# Patient Record
Sex: Female | Born: 1965 | Race: White | Hispanic: No | Marital: Married | Smoking: Current every day smoker
Health system: Southern US, Community
[De-identification: ages and names within clinical notes are randomized; demographics above are authoritative.]

## PROBLEM LIST (undated history)

## (undated) DIAGNOSIS — I10 Essential (primary) hypertension: Secondary | ICD-10-CM

## (undated) DIAGNOSIS — F17200 Nicotine dependence, unspecified, uncomplicated: Secondary | ICD-10-CM

## (undated) DIAGNOSIS — I712 Thoracic aortic aneurysm, without rupture, unspecified: Secondary | ICD-10-CM

## (undated) DIAGNOSIS — E7800 Pure hypercholesterolemia, unspecified: Secondary | ICD-10-CM

## (undated) HISTORY — DX: Nicotine dependence, unspecified, uncomplicated: F17.200

## (undated) HISTORY — DX: Thoracic aortic aneurysm, without rupture, unspecified: I71.20

## (undated) HISTORY — PX: WISDOM TOOTH EXTRACTION: SHX21

## (undated) HISTORY — DX: Essential (primary) hypertension: I10

## (undated) HISTORY — DX: Thoracic aortic aneurysm, without rupture: I71.2

## (undated) HISTORY — PX: TUBAL LIGATION: SHX77

## (undated) HISTORY — DX: Pure hypercholesterolemia, unspecified: E78.00

---

## 2005-04-26 ENCOUNTER — Ambulatory Visit: Payer: Self-pay | Admitting: Family Medicine

## 2005-07-18 HISTORY — PX: DESCENDING AORTIC ANEURYSM REPAIR: SHX1455

## 2005-11-03 ENCOUNTER — Ambulatory Visit: Payer: Self-pay | Admitting: Family Medicine

## 2005-11-08 ENCOUNTER — Encounter: Admission: RE | Admit: 2005-11-08 | Discharge: 2005-11-08 | Payer: Self-pay | Admitting: Vascular Surgery

## 2005-11-11 ENCOUNTER — Inpatient Hospital Stay (HOSPITAL_BASED_OUTPATIENT_CLINIC_OR_DEPARTMENT_OTHER): Admission: RE | Admit: 2005-11-11 | Discharge: 2005-11-11 | Payer: Self-pay | Admitting: Cardiology

## 2005-11-14 ENCOUNTER — Inpatient Hospital Stay (HOSPITAL_COMMUNITY): Admission: RE | Admit: 2005-11-14 | Discharge: 2005-11-18 | Payer: Self-pay | Admitting: Surgery

## 2005-11-14 ENCOUNTER — Encounter (INDEPENDENT_AMBULATORY_CARE_PROVIDER_SITE_OTHER): Payer: Self-pay | Admitting: *Deleted

## 2005-11-29 ENCOUNTER — Encounter: Admission: RE | Admit: 2005-11-29 | Discharge: 2005-11-29 | Payer: Self-pay | Admitting: Surgery

## 2006-03-02 ENCOUNTER — Ambulatory Visit: Payer: Self-pay | Admitting: Family Medicine

## 2006-05-23 ENCOUNTER — Ambulatory Visit: Payer: Self-pay | Admitting: Physician Assistant

## 2006-06-14 ENCOUNTER — Ambulatory Visit: Payer: Self-pay | Admitting: Family Medicine

## 2006-08-15 ENCOUNTER — Encounter: Admission: RE | Admit: 2006-08-15 | Discharge: 2006-08-15 | Payer: Self-pay | Admitting: Cardiology

## 2006-09-08 ENCOUNTER — Ambulatory Visit: Payer: Self-pay | Admitting: Family Medicine

## 2006-11-08 ENCOUNTER — Ambulatory Visit: Payer: Self-pay | Admitting: Family Medicine

## 2007-03-26 ENCOUNTER — Encounter: Admission: RE | Admit: 2007-03-26 | Discharge: 2007-03-26 | Payer: Self-pay | Admitting: Dermatology

## 2007-09-10 IMAGING — CT CT ANGIO ABDOMEN
2 of 7 series · 17 of 46 positions shown, 19 images · IV contrast ([ID] OMNI 300)
Comparison: none

CLINICAL DATA: Prestent AAA. 
 CT ANGIOGRAM OF THE ABDOMEN:
TECHNIQUE: Multidetector CT imaging of the abdomen was performed before and during bolus injection of intravenous contrast.  Multiplanar CT angiographic image reconstructions were generated to evaluate the vascular anatomy.
 Contrast:  150 cc Omnipaque 300
TECHNIQUE: Multidetector CT imaging of the pelvis was performed during bolus injection of intravenous contrast.  Multiplanar CT angiographic image reconstructions were generated to evaluate the vascular anatomy.

[Series 5: recon 2: angio · axial · 0.66mm/px · z∈[-366,-23]mm · 14 of 297 slices shown, 16 images]
[im 16/297  soft-tissue]
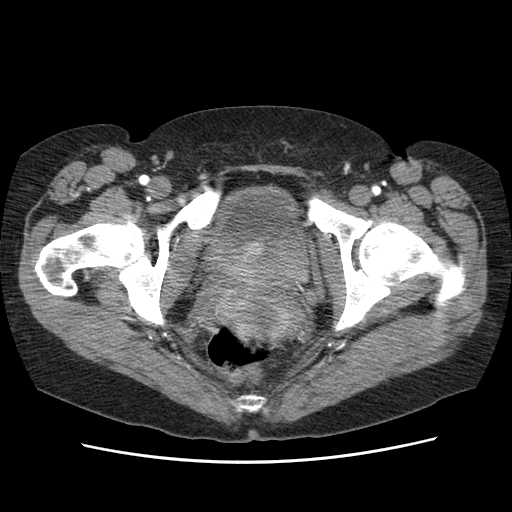
[im 16/297  bone]
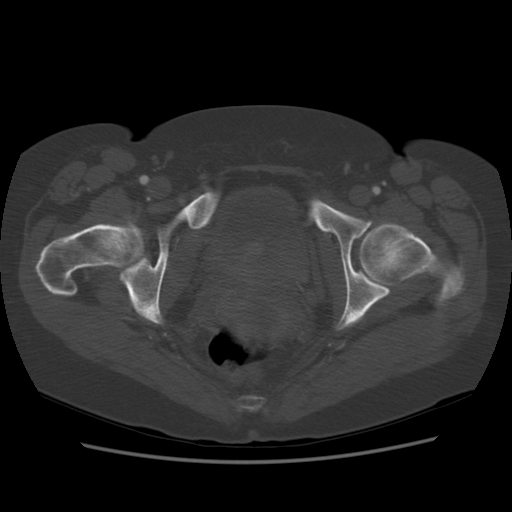
[im 32/297  soft-tissue]
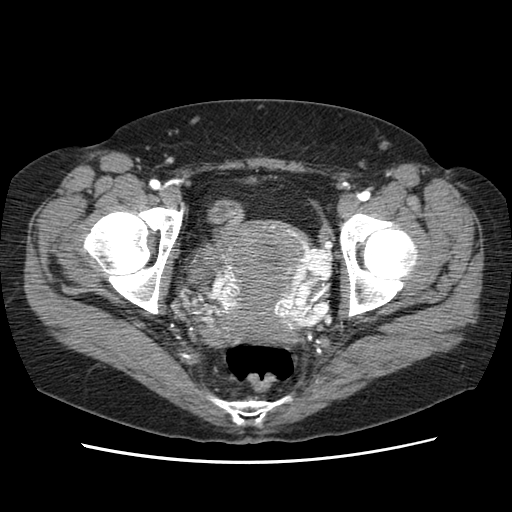
[im 63/297  soft-tissue]
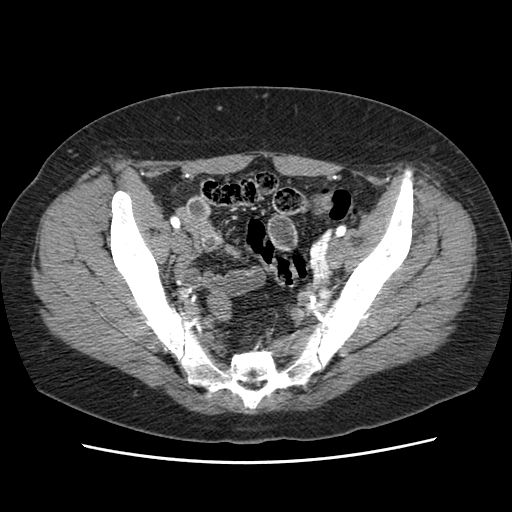
[im 78/297  soft-tissue]
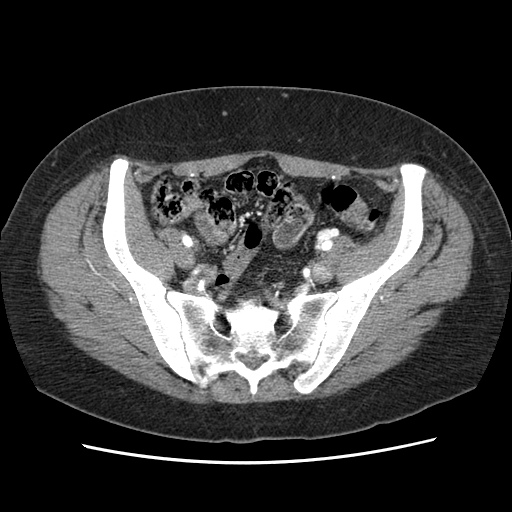
[im 94/297  soft-tissue]
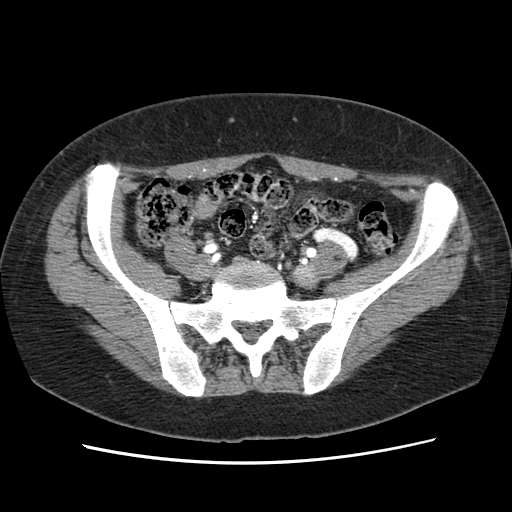
[im 125/297  soft-tissue]
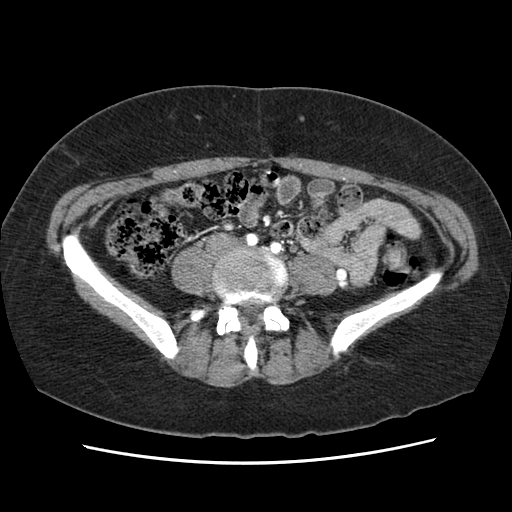
[im 141/297  soft-tissue]
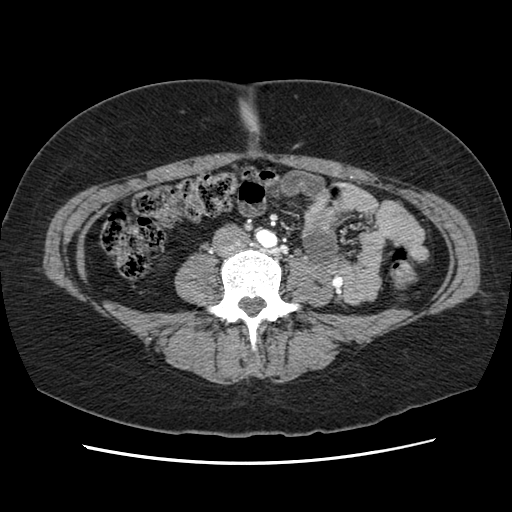
[im 156/297  soft-tissue]
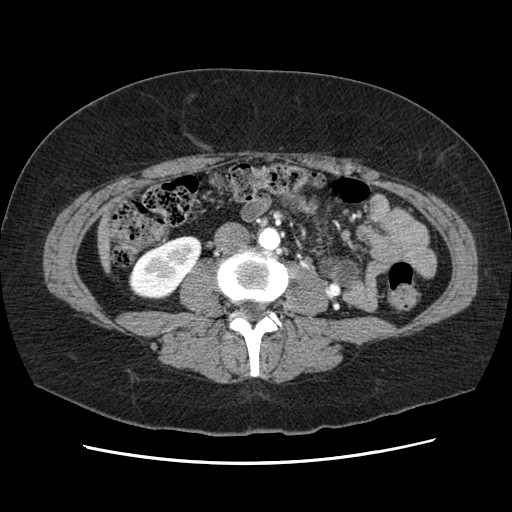
[im 172/297  soft-tissue]
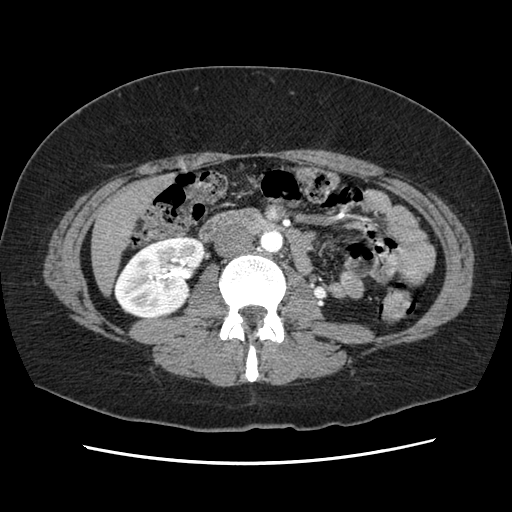
[im 172/297  bone]
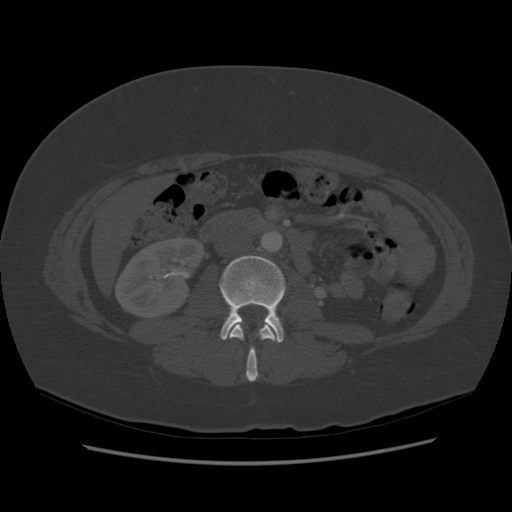
[im 203/297  soft-tissue]
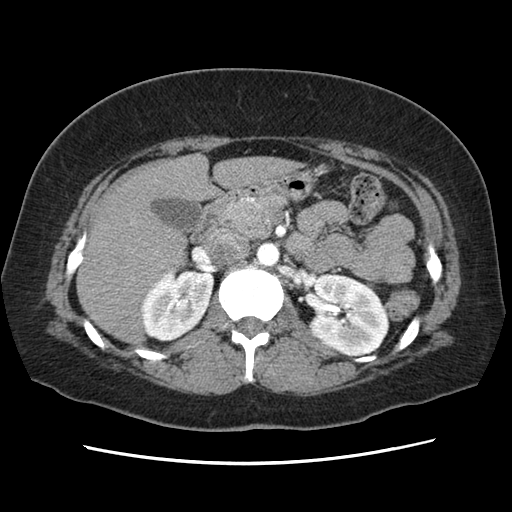
[im 219/297  soft-tissue]
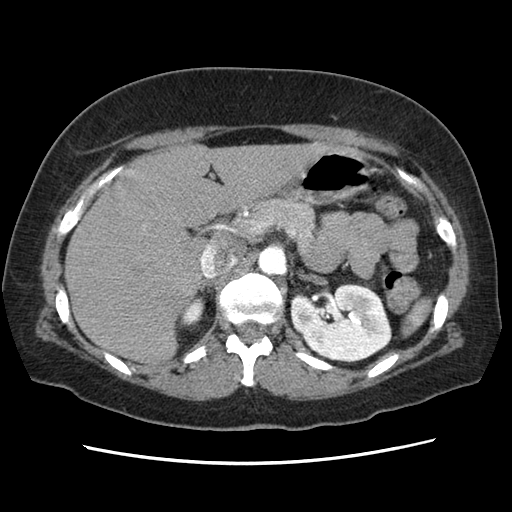
[im 234/297  soft-tissue]
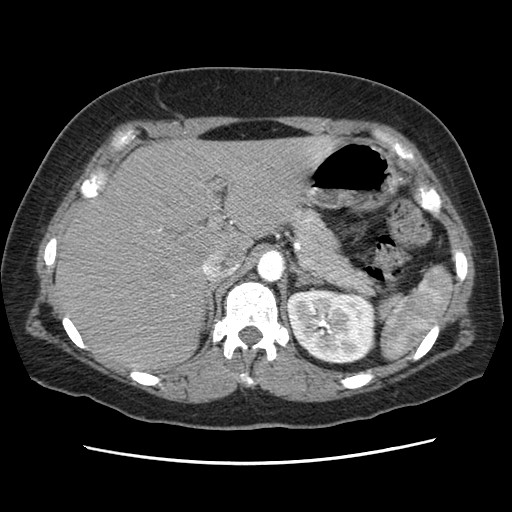
[im 265/297  soft-tissue]
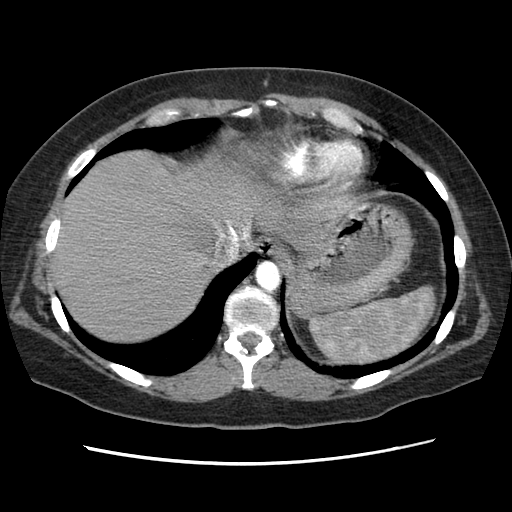
[im 281/297  soft-tissue]
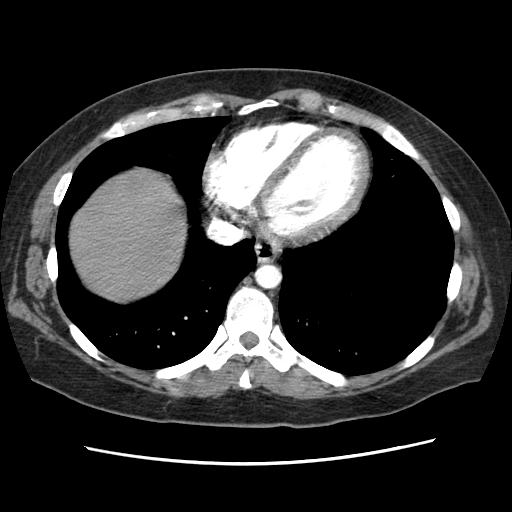

[Series 265: reformatted · sagittal · 0.80mm/px · 3 of 120 slices shown]
[im 40/120  soft-tissue]
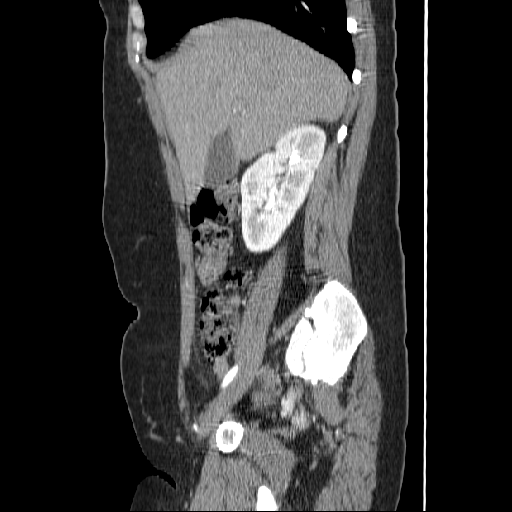
[im 53/120  soft-tissue]
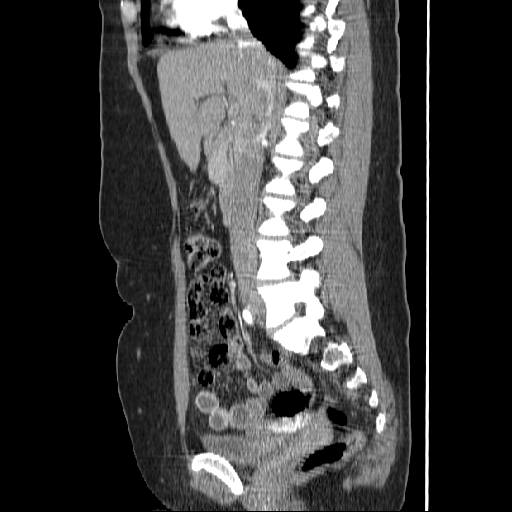
[im 67/120  soft-tissue]
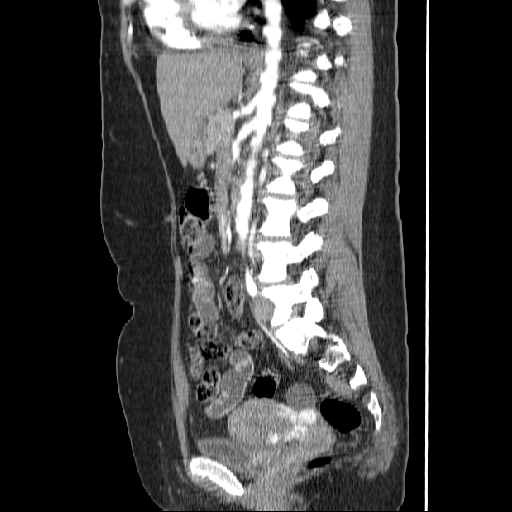

[17 of 46 positions shown; findings below may reference images not displayed]

FINDINGS: The scan demonstrates that there is some mild atheromatous irregularity of the distal abdominal aorta, but there is no evidence of aneurysm.  There is no significant calcification in the abdominal aorta or iliac arteries.  The celiac and superior mesenteric arteries are widely patent.  The patient has one widely patent right renal artery and two widely patent left renal arteries.  The lower pole branch is small on the left and is best seen on image #108 of series 5.  
 The liver, spleen, pancreas, adrenal glands, and kidneys appear normal.  No dilated bowel.  No bony abnormality.
IMPRESSION: Essentially normal CT angiogram of the abdomen.  The patient has two left renal arteries and a single right renal artery with some minimal atheromatous plaque in the distal abdominal aorta and proximal common iliac arteries.  
 CT ANGIOGRAM OF THE PELVIS:
FINDINGS: There is some mild atheromatous disease of the common iliac arteries bilaterally.  The external iliac and common femoral arteries appear normal. There is a 4.1 x 2.8 cm cyst on the left ovary.  The right ovary is not discretely identified.
IMPRESSION: Minimal atheromatous disease in the common iliac arteries.  No aneurysm formation or significant stenosis.

## 2007-09-17 IMAGING — CR DG CHEST 1V PORT
1 series · 1 of 1 positions shown · non-contrast
Comparison: 1-day prior.

CLINICAL DATA: Descending thoracic aneurysm.  
 PORTABLE CHEST ? 1 VIEW:

[view not recorded]
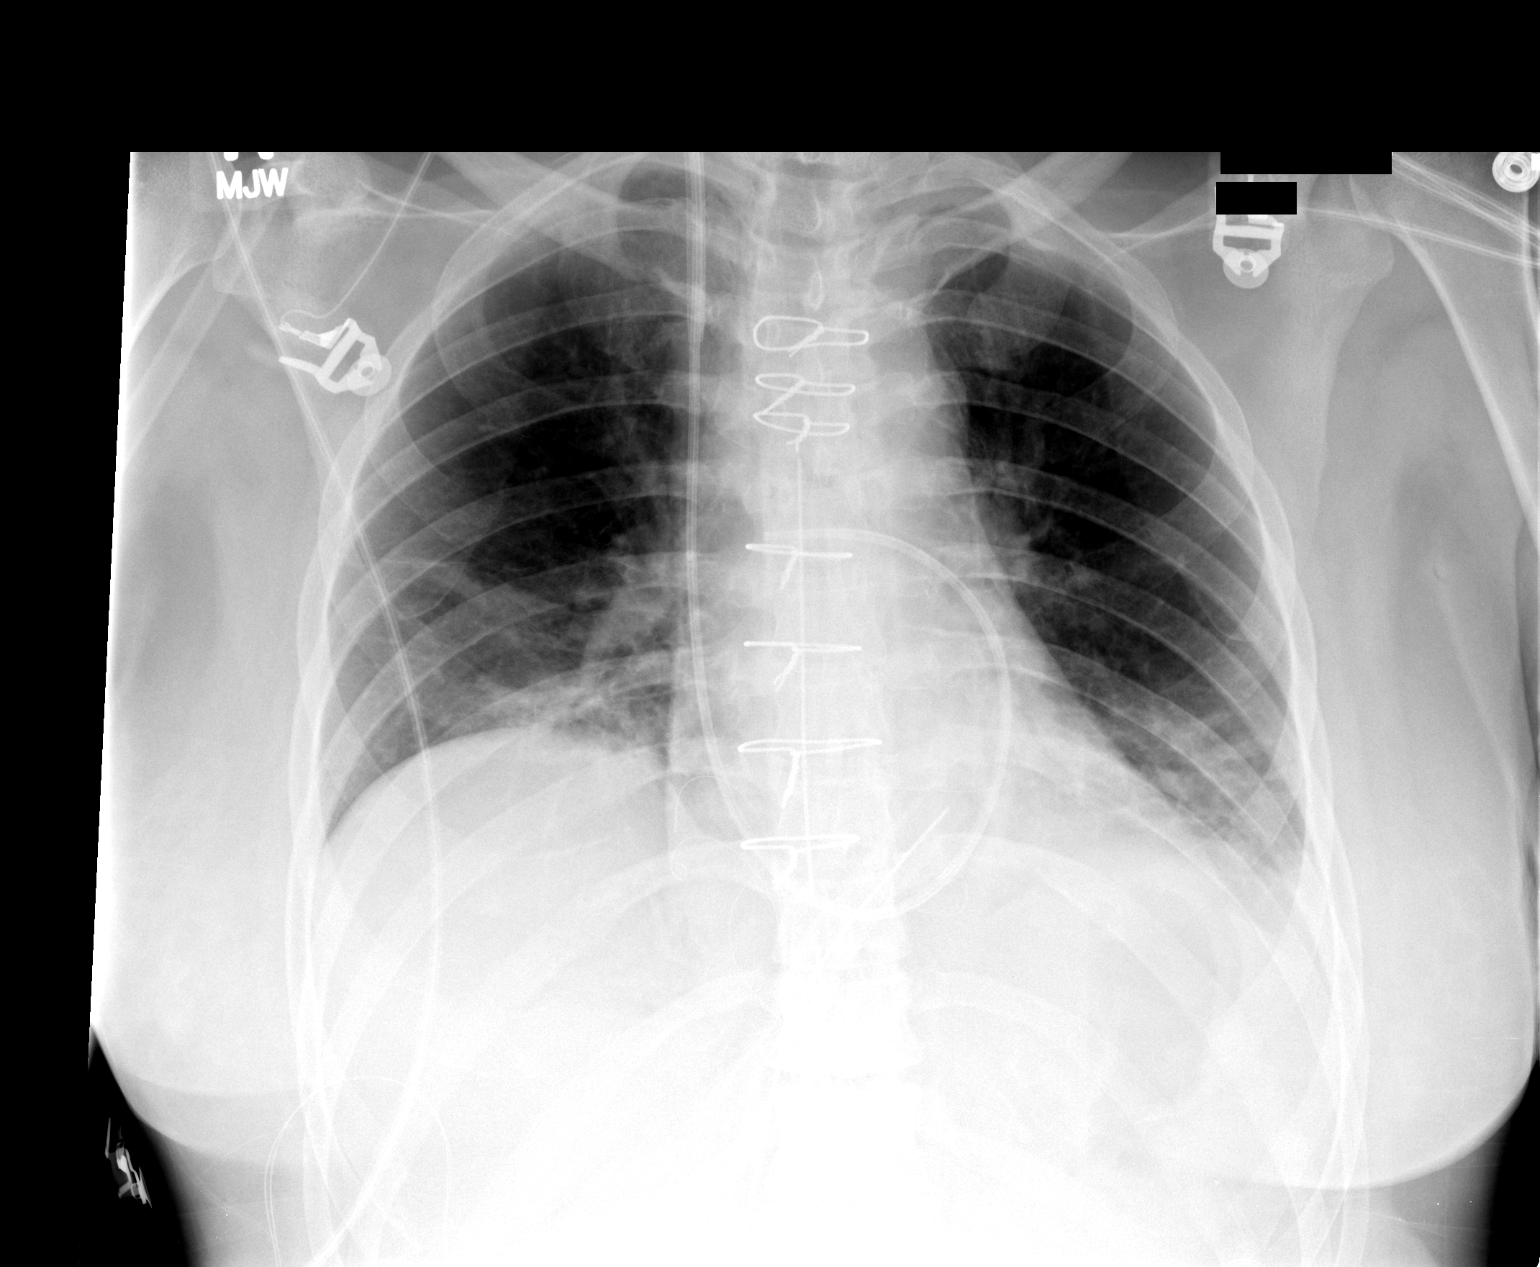

[1 of 1 positions shown; findings below may reference images not displayed]

FINDINGS: The patient has been extubated.  The nasogastric tube has been removed.  Median sternotomy.  Right IJ Swan-Ganz catheter unchanged in position with tip over the proximal right pulmonary artery.  Heart size is normal.  Lung volumes remain low.  Slight increase in bibasilar left greater than right airspace disease, likely atelectasis.  No pneumothorax.  Small left pleural effusion is suspected.
IMPRESSION: Endotracheal tube removed with slight decrease in aeration.  Increased bibasilar atelectasis and a small left pleural effusion.

## 2008-02-29 ENCOUNTER — Ambulatory Visit (HOSPITAL_COMMUNITY): Admission: RE | Admit: 2008-02-29 | Discharge: 2008-02-29 | Payer: Self-pay | Admitting: Family Medicine

## 2008-02-29 ENCOUNTER — Ambulatory Visit: Payer: Self-pay | Admitting: Vascular Surgery

## 2008-02-29 ENCOUNTER — Encounter (INDEPENDENT_AMBULATORY_CARE_PROVIDER_SITE_OTHER): Payer: Self-pay | Admitting: Family Medicine

## 2010-03-31 ENCOUNTER — Ambulatory Visit: Payer: Self-pay | Admitting: Cardiology

## 2010-09-30 ENCOUNTER — Ambulatory Visit (INDEPENDENT_AMBULATORY_CARE_PROVIDER_SITE_OTHER): Payer: BC Managed Care – PPO | Admitting: Cardiology

## 2010-09-30 DIAGNOSIS — E785 Hyperlipidemia, unspecified: Secondary | ICD-10-CM

## 2010-09-30 DIAGNOSIS — I712 Thoracic aortic aneurysm, without rupture, unspecified: Secondary | ICD-10-CM

## 2010-09-30 DIAGNOSIS — I10 Essential (primary) hypertension: Secondary | ICD-10-CM

## 2010-12-03 NOTE — Cardiovascular Report (Signed)
April [name], April [name]              ACCOUNT NO.:  [id number]   MEDICAL RECORD NO.:  [id number]          PATIENT TYPE:  OIB   LOCATION:  1967                         FACILITY:  [facility]   PHYSICIAN:  Peter M. Swaziland, M.D.  DATE OF BIRTH:  09/11/1965   DATE OF PROCEDURE:  11/11/2005  DATE OF DISCHARGE:                              CARDIAC CATHETERIZATION   INDICATIONS FOR PROCEDURE:  A 45 year old female with history of tobacco  abuse who presented with chest pain.  Subsequent evaluation demonstrated a  5.9 cm aneurysm involving the proximal aorta.  Cardiac catheterization was  done for preoperative assessment.   PROCEDURES:  1.  Left heart catheterization.  2.  Coronary and left ventricular angiography.  3.  Aortic root angiography.   ACCESS:  Via the right femoral artery using standard Seldinger technique.   EQUIPMENT:  A 4-French, 4 cm right and left Judkins catheter, 4-French  pigtail catheter, 4-French arterial sheath.   CONTRAST:  100 mL of Omnipaque.   MEDICATIONS:  Versed 2 mg IV, Benadryl 25 mg IV.  Local anesthesia with 1%  Xylocaine.   HEMODYNAMIC DATA:  Aortic pressure is 120/63 with mean of 88 mmHg.  Left  ventricle pressure is 121 with EDP of 12 mmHg.   ANGIOGRAPHIC DATA:  Left coronary arises and distributes normally.  Left  main coronary is normal.   The left anterior descending artery and its branches are normal.   The left circumflex coronary is normal.   The right coronary is dominant vessel and is normal.   Left ventricular angiography was performed in RAO view.  This demonstrates  normal left ventricular size, contractility with normal systolic function.  Ejection fraction is estimated at 60-65%.  There is no mitral regurgitation  or prolapse.  Aortic valve appears normal.   Aortic root angiography demonstrates a large saccular aneurysm involving the  proximal aorta beginning above the level of the sinus of Valsalva.  The  aortic valve appears normal.   There is no aortic insufficiency.  The aortic  aneurysm is calcified.   IMPRESSION:  1.  Normal coronary anatomy.  2.  Normal left ventricular function.  3.  Proximal aortic saccular aneurysm with calcification.   PLAN:  Proceed with aortic grafting per Dr. Laneta Simmers.           ______________________________  Peter M. Swaziland, M.D.     PMJ/MEDQ  D:  11/11/2005  T:  11/12/2005  Job:  [id number]   cc:   Evelene Croon, M.D.  [address]  [place]  [place] [address]   Delaney Meigs, M.D.  Fax: [phone]

## 2010-12-03 NOTE — Discharge Summary (Signed)
April [name], April [name]              ACCOUNT NO.:  [id number]   MEDICAL RECORD NO.:  [id number]          PATIENT TYPE:  INP   LOCATION:  2005                         FACILITY:  [facility]   PHYSICIAN:  April [name], M.D.     DATE OF BIRTH:  Feb 10, 1966   DATE OF ADMISSION:  11/14/2005  DATE OF DISCHARGE:  11/18/2005                                 DISCHARGE SUMMARY   HISTORY OF PRESENT ILLNESS:  The patient is a 45 year old female with no  prior medical history, although she has a long-term history of smoking, who  was referred to April [name] after recent evaluation for chest pain symptoms.  The patient has been having epigastric and substernal chest pain for the  past approximately two months.  A chest x-ray showed right hilar fullness,  and a CT scan of the chest done November 07, 2005, showed a 5.9 x 5 cm  abdominal aortic aneurysm.  This appeared to extend from the aortic root up  to the proximal aortic arch.  The remainder of the aortic arch as well as  the descending thoracic aorta was of normal caliber.  Echocardiogram showed  normal left ventricular size and function.  There was no evidence of aortic  valve stenosis or regurgitation.  There was no significant mitral  regurgitation.  Cardiac catheterization was performed and showed normal  coronary arteries.  There was normal left ventricular function.  The  aneurysm appeared to begin at the level of the sinotubular junction and  extended up to the proximal aortic arch.  She was referred to April [name],  M.D., for surgical evaluation.  The patient was evaluated.  It was Dr.  Sharee [name] opinion that she should undergo resection and grafting, and she was  admitted this hospitalization for the procedure.   PAST MEDICAL HISTORY:  1.  Hypercholesterolemia.  2.  Status post tubal ligation.   ALLERGIES:  No known drug allergies.   MEDICATIONS PRIOR TO ADMISSION:  Metoprolol 25 mg b.i.d.   Family history, social history, review of systems and  physical exam:  Please  see the history and physical done at the time of admission.   HOSPITAL COURSE:  The patient was admitted electively and taken to the  operating room on November 14, 2005, at which time she underwent the following  procedure:  Replacement of the abdominal aortic aneurysm using deep  hypothermic circulatory arrest, with a 28 mm Hemashield tube graft.  The  patient tolerated the procedure well, was taken to the surgical intensive  care unit in stable condition.   Postoperative hospital course:  The patient has done remarkably well.  She  has maintained stable hemodynamics.  She was weaned from the ventilator  without significant difficulty.  She was weaned from low-dose inotropes  without difficulty and has maintained stable hemodynamics.  She has  maintained normal sinus rhythm without significant cardiac dysrhythmias or  ectopy.  The patient's laboratories do reveal a mild postoperative anemia.  Her lab values are felt to be stable.  Most recent hemoglobin and hematocrit  dated Nov 16, 2005, is 9.4 and 27.7, respectively.  Electrolytes, BUN and  creatinine are all within normal limits.  She did have a slightly elevated  white blood cell count postoperatively, but this is felt to be secondary to  the use of intraoperative steroids.  She is showing no evidence of fevers or  infection.  She is tolerating routine increase in activity commensurate for  level of postoperative convalescence using routine protocols.  Her incisions  are healing well without signs of infection, and she has been weaned form  oxygen and maintains adequate saturations on room air.  Her overall status  is felt to be stable for tentative discharge in the morning of Nov 18, 2005,  pending morning round reevaluation.   CONDITION ON DISCHARGE:  Stable and improved.   MEDICATIONS ON DISCHARGE:  1.  Aspirin 325 mg daily.  2.  Toprol XL 25 mg daily.  3.  For pain, Tylox one or two every four to six  hours.   INSTRUCTIONS:  The patient received instructions regarding medications,  activity and diet, wound care and follow-up.  Follow-up will include Dr.  Swaziland in two weeks, April [name] on Nov 29, 2005, at 1 p.m.   FINAL DIAGNOSES:  1.  Abdominal aortic aneurysm as described, now status post resection and      grafting.  2.  Hypercholesterolemia.  3.  History of tobacco abuse.  4.  Previous tubal ligation.  5.  Postoperative anemia.      April [name], P.A.-C.      April [name], M.D.  Electronically Signed    WEG/MEDQ  D:  11/17/2005  T:  11/18/2005  Job:  [id number]   cc:   April [name], M.D.  Fax: [phone]

## 2010-12-03 NOTE — H&P (Signed)
NAMESOLINA, April [name] NO.:  [id number]   MEDICAL RECORD NO.:  [id number]           PATIENT TYPE:   LOCATION:                                 FACILITY:   PHYSICIAN:  Peter M. Swaziland, M.D.  DATE OF BIRTH:  [birth year]   DATE OF ADMISSION:  DATE OF DISCHARGE:                                HISTORY & PHYSICAL   HISTORY OF PRESENT ILLNESS:  April [name] is a 45 year old white female who  has a history of tobacco abuse. Over the past 2 months she has been  experiencing symptoms of chest pain she describes as a left precordial chest  pain predominantly the sensation of heaviness. She has had minimal shortness  of breath. Her chest pressure does not appear to be exertionally related.  She initially presented to the [facility] emergency room for evaluation and was  told that she had a mass on her chest x-ray. This led to a CT scan at  [facility] which demonstrated an ascending aortic aneurysm measuring  5.9 cm. She is now seen for preoperative evaluation for aneurysm repair. The  patient has no known history of hypertension.   PAST MEDICAL HISTORY:  1.  Hypercholesterolemia.  2.  Status post tubal ligation.   ALLERGIES:  She has no known allergies.   CURRENT MEDICATIONS:  Metoprolol 25 mg b.i.d.   SOCIAL HISTORY:  The patient works in Designer, fashion/clothing. She is married. She has no  children. She smokes 1 to 1.5 pack/day and has been a smoker for 25 years.  She denies alcohol use.   FAMILY HISTORY:  Father died at age 34 in a car wreck. Mother is 80 and has  hypertension. One brother has diabetes. One sister is alive and well.   REVIEW OF SYSTEMS:  She denies any orthopnea, PND or edema. She has had no  palpitations. She has had no syncope. She has no history of TIA or stroke.  She denies any claudication symptoms. Bowel and bladder habits have been  normal. Other review of systems are negative.   PHYSICAL EXAMINATION:  GENERAL:  The patient is a pleasant white female  in  no distress. Her weight is 153. Blood pressure is 122/70, pulse is 60 and  regular, respirations are normal.  HEENT EXAM:  Normocephalic, atraumatic. Pupils are equal, round and reactive  to light and accommodation. Sclerae are clear. Oropharynx is clear with  normal palate.  NECK:  Supple without JVD, adenopathy, thyromegaly or bruits.  LUNGS:  Clear to auscultation and percussion.  CARDIAC EXAM:  Reveals a regular rate and rhythm, normal S1 and S2 without  gallop or rub. There is a very soft 1/6 early diastolic murmur at the right  upper sternal border.  ABDOMEN:  Soft, nontender without hepatosplenomegaly, masses or bruits.  EXTREMITIES:  Femoral and pedal pulses are 2+ and symmetric.  NEUROLOGIC EXAM:  Nonfocal.   LABORATORY DATA:  ECG shows normal sinus rhythm at a rate of 57, normal ECG.  Chest x-ray shows aortic enlargement, otherwise no active disease. Other  laboratory data is pending at this time.   IMPRESSION:  1.  Ascending aortic aneurysm, symptomatic.  2.  Chest pain due to #1.  3.  Hypercholesterolemia.  4.  Tobacco abuse.   PLAN:  Will obtain routine lab work including CBC, chemistries, and coags.  Obtain an echocardiogram to make sure she does not have significant aortic  valve involvement and then plan on proceeding with diagnostic cardiac  catheterization to rule out concomitant coronary disease.           ______________________________  Peter M. Swaziland, M.D.     PMJ/MEDQ  D:  11/09/2005  T:  11/09/2005  Job:  [id number]   cc:   Evelene Croon, M.D.  [address]  [place]  [place] [address]   Delaney Meigs, M.D.  Fax: [phone]

## 2010-12-03 NOTE — Op Note (Signed)
April [name], April [name]              ACCOUNT NO.:  [id number]   MEDICAL RECORD NO.:  [id number]          PATIENT TYPE:  INP   LOCATION:  2312                         FACILITY:  [facility]   PHYSICIAN:  Burna Forts, M.D.DATE OF BIRTH:  1965-11-14   DATE OF PROCEDURE:  11/14/2005  DATE OF DISCHARGE:                                 OPERATIVE REPORT   PROCEDURE PERFORMED:  Transesophageal echocardiogram.   INDICATIONS FOR PROCEDURE:  April [name] is a 45 year old patient of April [name], M.D., who presents today for resection and repair of an ascending  thoracic aneurysm.  We plan to place the transesophageal echo probe for  evaluation of cardiac structures and functioning and for pre and post  aneurysm repair.   Pre cardiopulmonary bypass transesophageal echocardiogram examination:   Left ventricle.  This is entirely normal.  Left ventricular chamber seen in  short axis and long axis views.  Normal wall thickness.  Excellent  contractility.  Good contractility noted in all walls in the short axis  views.  The left ventricular outflow tract was analyzed in detail as well.  It is approximately 2 cm in width and there is no aortic insufficiency noted  at this time.   Mitral valve.  Thin, compliant, mobile mitral valve apparatus.   Left atrium.  Normal left atrial chamber and appendage visualized.   Aortic valve.  Thin, compliant, three cusps of the aortic valve.  Leaflet  edges are seen with ease.  Short axis view revealed good overall opening  during systolic ejection and appropriate closing with just a very trace  width of aortic insufficiency noted in diastole.  Long axis views  again  revealed an annular area of 2 cm in diameter.  A sinotubular area slightly  increased in size of 2.4 cm in diameter. Just above the level of the  sinotubular junction could be seen the beginning of the dilatation portion  of the ascending aorta.  Of note, this was about 5 to 5.5 cm in diameter in  that arch.  The aneurysm extended up to the arch at the take off of the  great vessels but I could not satisfactorily examine with the TE, anything  significant at the arch level.  The aneurysm appeared to extend from  proximally just above the sinotubular junction to the just below the arch.  Inside this dilated portion, the aorta could be seen.  A very shaggy  abnormal looking endothelium which would be graded as moderate aortic  sclerosis.  Usually shaggy borders are indicative of somewhat advanced  aortic sclerosis inside the area of the aortic aneurysm area.   Right ventricle was normal.   Tricuspid valve essentially normal.   Right atrium also was normal.   The patient was placed on cardiopulmonary bypass, ultimately circulatory  arrest was performed.  During this time, the diseased portion of the  ascending aorta was removed and replaced with a conduit sewn in proximally  and distally by Dr. Laneta Simmers.  Refer to his note please.  Deairing maneuvers  were carried out.  The patient was rewarmed and separated from  cardiopulmonary  bypass with the initial attempt.   Post cardiopulmonary bypass transesophageal echocardiogram:   Left ventricle:  Good overall left ventricular contractility remained as  before.   Directed exam of the ascending aorta: Again, the area of the conduit where  there is patch of the sinotubular area or just above could be seen.  It was  sutured in appropriately.  The whole area of the conduit was able to be  visualized and was appropriately seated.  Doppler examination across the  aortic valve and the ascending aorta revealed essentially no regurgitant  flow, no stenotic areas and no apparent leakage.  This was considered a good  repair.  The rest of the cardiac examination was as previously described and  the patient returned to the cardiac intensive care unit in stable condition.           ______________________________  Burna Forts, M.D.      JTM/MEDQ  D:  11/14/2005  T:  11/15/2005  Job:  [id number]

## 2010-12-03 NOTE — Op Note (Signed)
[name], April              ACCOUNT NO.:  [id number]   MEDICAL RECORD NO.:  [id number]          PATIENT TYPE:  INP   LOCATION:  2312                         FACILITY:  [facility]   PHYSICIAN:  Evelene Croon, M.D.     DATE OF BIRTH:  [birth year]   DATE OF PROCEDURE:  11/14/2005  DATE OF DISCHARGE:                                 OPERATIVE REPORT   PREOPERATIVE DIAGNOSIS:  A 6 cm ascending aortic aneurysm.   POSTOPERATIVE DIAGNOSIS:  A 6 cm ascending aortic aneurysm.   OPERATION PERFORMED:  Median sternotomy, extracorporeal circulation via the  right common femoral artery and right atrium, replacement of ascending  aortic aneurysm using deep hypothermic circulatory arrest with a 28 mm  Hemashield tube graft.   SURGEON:  Alleen Borne, M.D.   ASSISTANT:  Constance Holster, [place]   ANESTHESIA:  General endotracheal.   INDICATIONS FOR PROCEDURE:  The patient is a 45 year old smoker with no  prior medical history, who presented with epigastric and substernal chest  pain for one to two months.  A recent chest x-ray showed right hilar  fullness and a CT scan of the chest on November 07, 2005 showed a 5.9 x 5 cm  ascending aortic aneurysm.  This appeared to extend from the aortic root up  to the proximal aortic arch.  The remainder of the aortic arch as well as  the descending thoracic aorta was of normal caliber.  Preoperative cardiac  consultation was obtained with Dr. Peter Swaziland.  An echocardiogram showed  normal left ventricular size and function.  The aortic valve was normal  without stenosis or regurgitation.  There was no significant mitral  regurgitation.  Cardiac catheterization was performed which showed normal  coronary arteries.  There was normal left ventricular function.  The  aneurysm appeared to begin at the level of the sinotubular junction and  extended up to the proximal aortic arch.  After review of all these studies,  I recommended proceeding with replacement of her  ascending aortic aneurysm.  I discussed the operative procedure in detail with the patient and her  family including alternatives, benefits, and risks including but not limited  to bleeding, blood transfusion, infection, stroke, myocardial infarction,  and death.  She understood and agreed to proceed.   DESCRIPTION OF PROCEDURE:  The patient was taken to the operating room and  placed on the table in supine position.  After induction of general  endotracheal anesthesia, a Foley catheter was placed in the bladder using  sterile technique.  Then the chest, abdomen and both lower extremities were  prepped and draped in the usual sterile manner.  The chest was entered  through a median sternotomy incision and the pericardium opened in the  midline.  Examination of the heart showed good ventricular contractility.  The ascending aorta was markedly aneurysmal and this appeared to begin at  about the level of the sinotubular junction.  This extended up to the  proximal portion of the aortic arch.  The innominate artery appeared to be  of normal caliber.   A transesophageal echocardiogram was performed  by anesthesiology.  This  showed normal left ventricular function.  There was trivial central aortic  regurgitation and no aortic stenosis.  The aortic valve leaflets appeared  normal.  There was no mitral regurgitation.   Then the right common femoral artery was exposed through a vertical groin  incision.  Proximal and distal control of the right common femoral artery  was obtained with vessel loops.  Then the patient was heparinized and when  an adequate activated clotting time was achieved, the right common femoral  artery was cannulated using an 18 French arterial cannula for inflow.  Venous outflow was achieved using a 2-stage venous cannula through the right  atrial appendage. A retrograde cardioplegia cannula was inserted through the  right atrium into to the coronary sinus.  A left  ventricular vent was placed  through the right superior pulmonary vein.   The patient was placed on cardiopulmonary bypass and cooled to 18 degrees  centigrade.  When the patient reached rectal and bladder temperature of  about 19 degrees centigrade, the head was placed in the Trendelenburg  position.  Deep hypothermic circulatory arrest was begun.  The aorta was  transected distally.  The line of transection was adjacent to the take off  of the innominate artery extending across the undersurface of the aortic  arch.  During the period of hypothermic circulatory arrest, retrograde  cerebroplegia was given through a cannula placed directly into the superior  vena cava which was encircled with a tape.   Then a 28 mm Hemashield tube graft was chosen.  This had lot #[id number].  This  was then anastomosed to the undersurface of the aortic arch in an end-to-end  manner using continuous 3-0 Prolene suture with a felt strip to reinforce  the anastomosis.  The anastomosis was then coated with Bioglue for  hemostasis.  Then a clamp was placed across the graft and circulation  restarted.  Total circulatory arrest time was 23 minutes.  The patient was  then rewarmed to 37 degrees centigrade.  The ascending aneurysm was then  opened longitudinally.  This was an atherosclerotic aneurysm with a large  amount of debris present within and multiple small ulcerations within the  wall.  This aneurysm appeared to begin right at the sinotubular junction.  The aortic sinuses appeared unremarkable.  The aortic valve itself appeared  soft and unremarkable.  The right and left coronary ostia had no disease  around them. They did have a somewhat higher take off than normal and were  located about midway up aortic sinus.  Then the tube graft was anastomosed  to the proximal aorta at the sinotubular junction in end-to-end manner using  continuous 3-0 Prolene suture with a felt strip to reinforce the anastomosis.  The  anastomosis was coated with Bioglue.  Then a vent was  placed into the graft.  The left side of the heart was deaired.  With the  patient in Trendelenburg position, the cross-clamp was removed with a time  of 74 minutes.  There was spontaneous return of sinus rhythm.  The  anastomosis appeared hemostatic.  Then two temporary right ventricular and  right atrial pacing wires were placed and brought out through the skin.   When the patient had rewarmed to 37 degrees centigrade, she was weaned from  cardiopulmonary bypass on low-dose dopamine.  Total bypass time was 113  minutes.  Cardiac function appeared excellent.  Transesophageal  echocardiogram showed normal aortic valve function with no insufficiency.  Left  ventricular function was normal.  There was no mitral regurgitation.  Then Protamine was given.  The venous cannulas were removed. The right  common femoral cannula was removed and the cannulation site repaired using  continuous 6-0 Prolene suture.  There was a good pulsation beyond the repair  site.  Hemostasis was achieved.  Two chest tubes were placed with a tube in  the posterior pericardium and one in the anterior mediastinum.  The  pericardium was loosely approximated over the heart.  The sternum was closed  with #6 stainless steel wires.  The fascia was closed with continuous #1  Vicryl suture.  The subcutaneous tissue was closed with continuous 2-0  Vicryl and the skin with 3-0 Vicryl subcuticular closure.  The right groin  incision was then closed in layers in a similar manner.  Sponge, needle and  instrument  counts were correct according to the scrub nurse.  Dry sterile dressings  were applied over the incision and around the chest tubes which were hooked  to Pleur-evac suction.  The patient remained hemodynamically stable and was  transported to the SICU in guarded but stable condition.      Evelene Croon, M.D.  Electronically Signed     BB/MEDQ  D:  11/14/2005  T:   11/15/2005  Job:  [id number]   cc:   Peter M. Swaziland, M.D.  Fax: [phone]   Cath lab

## 2011-03-22 ENCOUNTER — Encounter: Payer: Self-pay | Admitting: Cardiology

## 2011-03-24 ENCOUNTER — Encounter: Payer: Self-pay | Admitting: Cardiology

## 2011-04-01 ENCOUNTER — Ambulatory Visit: Payer: BC Managed Care – PPO | Admitting: Cardiology

## 2011-04-27 ENCOUNTER — Ambulatory Visit: Payer: BC Managed Care – PPO | Admitting: Cardiology

## 2011-04-28 ENCOUNTER — Ambulatory Visit: Payer: BC Managed Care – PPO | Admitting: Cardiology

## 2011-05-05 ENCOUNTER — Ambulatory Visit: Payer: BC Managed Care – PPO | Admitting: Cardiology

## 2011-06-06 ENCOUNTER — Ambulatory Visit: Payer: BC Managed Care – PPO | Admitting: Cardiology

## 2011-06-17 ENCOUNTER — Encounter: Payer: Self-pay | Admitting: Cardiology

## 2011-06-17 ENCOUNTER — Ambulatory Visit (INDEPENDENT_AMBULATORY_CARE_PROVIDER_SITE_OTHER): Payer: BC Managed Care – PPO | Admitting: Cardiology

## 2011-06-17 VITALS — BP 112/68 | HR 89 | Ht 64.0 in | Wt 165.0 lb

## 2011-06-17 DIAGNOSIS — Z720 Tobacco use: Secondary | ICD-10-CM

## 2011-06-17 DIAGNOSIS — Z9889 Other specified postprocedural states: Secondary | ICD-10-CM

## 2011-06-17 DIAGNOSIS — I10 Essential (primary) hypertension: Secondary | ICD-10-CM | POA: Insufficient documentation

## 2011-06-17 DIAGNOSIS — Z8679 Personal history of other diseases of the circulatory system: Secondary | ICD-10-CM | POA: Insufficient documentation

## 2011-06-17 DIAGNOSIS — F17200 Nicotine dependence, unspecified, uncomplicated: Secondary | ICD-10-CM | POA: Insufficient documentation

## 2011-06-17 DIAGNOSIS — E7800 Pure hypercholesterolemia, unspecified: Secondary | ICD-10-CM

## 2011-06-17 NOTE — Assessment & Plan Note (Signed)
Have encouraged her to quit smoking. She is planning to take a class through her employer to help quit smoking.

## 2011-06-17 NOTE — Progress Notes (Signed)
   [name] Date of Birth: May 12, 1966 Medical Record #[id number]  History of Present Illness: April [name] is seen today for followup. She status post thoracic aneurysm repair in 2007. She has been feeling very well. She was switched from Lipitor to Livalo for myalgias. She is tolerating this better. She does continue to smoke less than one pack per day. She is active. She denies any chest pain or shortness of breath.  Current Outpatient Prescriptions on File Prior to Visit  Medication Sig Dispense Refill  . aspirin 325 MG tablet Take 325 mg by mouth daily.        . Omega-3 Fatty Acids (FISH OIL PO) Take 2,000 mg by mouth daily.          No Known Allergies  Past Medical History  Diagnosis Date  . Thoracic aortic aneurysm     Ascending. Status post repair in April 2007  . Hypertension   . Hypercholesterolemia   . Tobacco dependence     Past Surgical History  Procedure Date  . Descending aortic aneurysm repair 2007    Status post descending with a #27mm Hemashield tube graft in April 2007.  . Tubal ligation     History  Smoking status  . Current Everyday Smoker -- 1.0 packs/day  Smokeless tobacco  . Not on file    History  Alcohol Use No    Family History  Problem Relation Age of Onset  . Hypertension Mother   . Diabetes Brother     Review of Systems: As noted in history of present illness. She has lost 25 pounds since her last visit. All other systems were reviewed and are negative.  Physical Exam: BP 112/68  Pulse 89  Ht 5\' 4"  (1.626 m)  Wt 165 lb (74.844 kg)  BMI 28.32 kg/m2 The patient is alert and oriented x 3.  The mood and affect are normal.  The skin is warm and dry.  Color is normal.  The HEENT exam reveals that the sclera are nonicteric.  The mucous membranes are moist.  The carotids are 2+ without bruits.  There is no thyromegaly.  There is no JVD.  The lungs are clear.  The chest wall is non tender.  The heart exam reveals a regular rate with a  normal S1 and S2.  There are no murmurs, gallops, or rubs.  The PMI is not displaced.   Abdominal exam reveals good bowel sounds.  There is no guarding or rebound.  There is no hepatosplenomegaly or tenderness.  There are no masses.  Exam of the legs reveal no clubbing, cyanosis, or edema.  The legs are without rashes.  The distal pulses are intact.  Cranial nerves II - XII are intact.  Motor and sensory functions are intact.  The gait is normal.  LABORATORY DATA: ECG today is normal. Blood work dated 06/14/2011 showed a normal chemistry panel. Total cholesterol is 153, triglycerides 75, LDL 101, HDL 37.  Assessment / Plan:

## 2011-06-17 NOTE — Patient Instructions (Signed)
You need to quit smoking.   Continue your other therapy.   I will see you again in 6 months.

## 2011-06-17 NOTE — Assessment & Plan Note (Signed)
No complaints

## 2011-12-15 ENCOUNTER — Encounter: Payer: Self-pay | Admitting: Cardiology

## 2011-12-15 ENCOUNTER — Ambulatory Visit (INDEPENDENT_AMBULATORY_CARE_PROVIDER_SITE_OTHER): Payer: BC Managed Care – PPO | Admitting: Cardiology

## 2011-12-15 VITALS — BP 132/82 | HR 65 | Ht 64.0 in | Wt 167.0 lb

## 2011-12-15 DIAGNOSIS — I10 Essential (primary) hypertension: Secondary | ICD-10-CM

## 2011-12-15 DIAGNOSIS — I712 Thoracic aortic aneurysm, without rupture, unspecified: Secondary | ICD-10-CM

## 2011-12-15 DIAGNOSIS — Z9889 Other specified postprocedural states: Secondary | ICD-10-CM

## 2011-12-15 DIAGNOSIS — E7800 Pure hypercholesterolemia, unspecified: Secondary | ICD-10-CM

## 2011-12-15 DIAGNOSIS — Z8679 Personal history of other diseases of the circulatory system: Secondary | ICD-10-CM

## 2011-12-15 DIAGNOSIS — Z720 Tobacco use: Secondary | ICD-10-CM

## 2011-12-15 DIAGNOSIS — E785 Hyperlipidemia, unspecified: Secondary | ICD-10-CM

## 2011-12-15 DIAGNOSIS — F17200 Nicotine dependence, unspecified, uncomplicated: Secondary | ICD-10-CM

## 2011-12-15 LAB — HEPATIC FUNCTION PANEL
AST: 18 U/L (ref 0–37)
Albumin: 3.6 g/dL (ref 3.5–5.2)
Bilirubin, Direct: 0 mg/dL (ref 0.0–0.3)
Total Bilirubin: 0.4 mg/dL (ref 0.3–1.2)
Total Protein: 7.1 g/dL (ref 6.0–8.3)

## 2011-12-15 LAB — BASIC METABOLIC PANEL
CO2: 24 mEq/L (ref 19–32)
Calcium: 9 mg/dL (ref 8.4–10.5)
Chloride: 110 mEq/L (ref 96–112)

## 2011-12-15 LAB — LIPID PANEL
LDL Cholesterol: 88 mg/dL (ref 0–99)
VLDL: 10 mg/dL (ref 0.0–40.0)

## 2011-12-15 NOTE — Progress Notes (Signed)
Addended by: Tonita Phoenix on: 12/15/2011 09:01 AM   Modules accepted: Orders

## 2011-12-15 NOTE — Assessment & Plan Note (Signed)
We have reviewed recommendations for complete smoking cessation. Discussed potential adverse effects of the electronic cigarette with high doses of nicotine.

## 2011-12-15 NOTE — Assessment & Plan Note (Signed)
Blood pressure control is good. 

## 2011-12-15 NOTE — Assessment & Plan Note (Signed)
She is asymptomatic from her previous aneurysm repair. Last CT in 2008.

## 2011-12-15 NOTE — Patient Instructions (Signed)
Continue your current medication.  Stop smoking completely.  We will call with the results of your lab work today.

## 2011-12-15 NOTE — Assessment & Plan Note (Signed)
She would like her fasting lab work checked today so we will check chemistries and a lipid panel.

## 2011-12-15 NOTE — Progress Notes (Signed)
   April [name] Date of Birth: 1966/05/27 Medical Record #[id number]  History of Present Illness: April [name] is seen today for followup. She status post thoracic aneurysm repair in 2007. She has been feeling very well. She is taking a Livalo and does complain of myalgias particularly in the morning but is sticking with it. She is trying to quit smoking and is now using electronic cigarettes to help. April [name] is down to about 2 cigarettes per day. She denies any significant chest or thoracic back pain. She has no dyspnea.  Current Outpatient Prescriptions on File Prior to Visit  Medication Sig Dispense Refill  . aspirin 325 MG tablet Take 325 mg by mouth daily.        Marland Kitchen HYDROcodone-acetaminophen (VICODIN) 5-500 MG per tablet Take 1 tablet by mouth as needed.      . Omega-3 Fatty Acids (FISH OIL PO) Take 2,000 mg by mouth daily.        . Pitavastatin Calcium (LIVALO) 4 MG TABS Take 1 tablet by mouth daily.          No Known Allergies  Past Medical History  Diagnosis Date  . Thoracic aortic aneurysm     Ascending. Status post repair in April 2007  . Hypertension   . Hypercholesterolemia   . Tobacco dependence     Past Surgical History  Procedure Date  . Descending aortic aneurysm repair 2007    Status post descending with a #35mm Hemashield tube graft in April 2007.  . Tubal ligation     History  Smoking status  . Current Everyday Smoker -- 1.0 packs/day  Smokeless tobacco  . Not on file    History  Alcohol Use No    Family History  Problem Relation Age of Onset  . Hypertension Mother   . Diabetes Brother     Review of Systems: As noted in history of present illness.  All other systems were reviewed and are negative.  Physical Exam: BP 132/82  Pulse 65  Ht 5\' 4"  (1.626 m)  Wt 167 lb (75.751 kg)  BMI 28.67 kg/m2 The patient is alert and oriented x 3.   The HEENT exam is unremarkable.  The carotids are 2+ without bruits.  There is no thyromegaly.  There is no JVD.  The  lungs are clear.  The chest wall is non tender.  The heart exam reveals a regular rate with a normal S1 and S2.  There are no murmurs, gallops, or rubs.  The PMI is not displaced.   Abdominal exam reveals good bowel sounds. Exam of the legs reveal no clubbing, cyanosis, or edema.  The legs are without rashes.  The distal pulses are intact.  Cranial nerves II - XII are intact.  Motor and sensory functions are intact.  The gait is normal.  LABORATORY DATA:   Assessment / Plan:

## 2012-07-02 ENCOUNTER — Encounter: Payer: Self-pay | Admitting: Cardiology

## 2012-07-04 ENCOUNTER — Encounter: Payer: Self-pay | Admitting: Cardiology

## 2012-07-04 ENCOUNTER — Ambulatory Visit (INDEPENDENT_AMBULATORY_CARE_PROVIDER_SITE_OTHER): Payer: BC Managed Care – PPO | Admitting: Cardiology

## 2012-07-04 ENCOUNTER — Other Ambulatory Visit: Payer: Self-pay

## 2012-07-04 VITALS — BP 124/64 | HR 84 | Wt 174.0 lb

## 2012-07-04 DIAGNOSIS — Z9889 Other specified postprocedural states: Secondary | ICD-10-CM

## 2012-07-04 DIAGNOSIS — Z720 Tobacco use: Secondary | ICD-10-CM

## 2012-07-04 DIAGNOSIS — Z8679 Personal history of other diseases of the circulatory system: Secondary | ICD-10-CM

## 2012-07-04 DIAGNOSIS — I10 Essential (primary) hypertension: Secondary | ICD-10-CM

## 2012-07-04 DIAGNOSIS — F17200 Nicotine dependence, unspecified, uncomplicated: Secondary | ICD-10-CM

## 2012-07-04 NOTE — Patient Instructions (Addendum)
Stop smoking  I will see you again in 6 months.

## 2012-07-04 NOTE — Progress Notes (Signed)
   April [name] Date of Birth: 04-17-1966 Medical Record #[id number]  History of Present Illness: April [name] is seen today for followup. She status post thoracic aneurysm repair in 2007. She has been feeling very well.she reports her lab work was recently checked and that her cholesterol is okay. Her medication was changed but she cannot remember the name of the new medication. She continues to smoke. She does exercise some but not as often as she used to. She denies any chest pain, shortness of breath, or palpitations.   Current Outpatient Prescriptions on File Prior to Visit  Medication Sig Dispense Refill  . aspirin 325 MG tablet Take 325 mg by mouth daily.        Marland Kitchen HYDROcodone-acetaminophen (VICODIN) 5-500 MG per tablet Take 1 tablet by mouth as needed.      . Omega-3 Fatty Acids (FISH OIL PO) Take 2,000 mg by mouth daily.          No Known Allergies  Past Medical History  Diagnosis Date  . Thoracic aortic aneurysm     Ascending. Status post repair in April 2007  . Hypertension   . Hypercholesterolemia   . Tobacco dependence     Past Surgical History  Procedure Date  . Descending aortic aneurysm repair 2007    Status post ascending with a #3mm Hemashield tube graft in April 2007.  . Tubal ligation     History  Smoking status  . Current Every Day Smoker -- 1.0 packs/day  Smokeless tobacco  . Not on file    History  Alcohol Use No    Family History  Problem Relation Age of Onset  . Hypertension Mother   . Diabetes Brother     Review of Systems: As noted in history of present illness.  All other systems were reviewed and are negative.  Physical Exam: BP 124/64  Pulse 84  Wt 174 lb (78.926 kg) The patient is alert and oriented x 3.   The HEENT exam is unremarkable.  The carotids are 2+ without bruits.  There is no thyromegaly.  There is no JVD.  The lungs are clear.  The chest wall is non tender.  The heart exam reveals a regular rate with a normal S1 and S2.   There are no murmurs, gallops, or rubs.  The PMI is not displaced.   Abdominal exam reveals good bowel sounds. Exam of the legs reveal no clubbing, cyanosis, or edema.  The legs are without rashes.  The distal pulses are intact.  Cranial nerves II - XII are intact.  Motor and sensory functions are intact.  The gait is normal.  LABORATORY DATA: ECG today shows normal sinus rhythm with a rate of 84 beats per minute. It is normal.  Assessment / Plan: 1. Thoracic aortic aneurysm status post repair in 2007. Patient is asymptomatic. Continue risk factor modification.  2. Hypercholesterolemia.  3. Tobacco abuse. Patient understands the importance of smoking cessation. She is still going to try and quit.

## 2012-12-27 ENCOUNTER — Encounter (HOSPITAL_COMMUNITY): Payer: Self-pay | Admitting: Emergency Medicine

## 2012-12-27 ENCOUNTER — Emergency Department (HOSPITAL_COMMUNITY): Payer: BC Managed Care – PPO

## 2012-12-27 ENCOUNTER — Emergency Department (HOSPITAL_COMMUNITY)
Admission: EM | Admit: 2012-12-27 | Discharge: 2012-12-27 | Disposition: A | Discharge status: Home / Self Care | Payer: BC Managed Care – PPO | Attending: Emergency Medicine | Admitting: Emergency Medicine

## 2012-12-27 DIAGNOSIS — R059 Cough, unspecified: Secondary | ICD-10-CM | POA: Insufficient documentation

## 2012-12-27 DIAGNOSIS — I10 Essential (primary) hypertension: Secondary | ICD-10-CM | POA: Insufficient documentation

## 2012-12-27 DIAGNOSIS — R0789 Other chest pain: Secondary | ICD-10-CM

## 2012-12-27 DIAGNOSIS — F17200 Nicotine dependence, unspecified, uncomplicated: Secondary | ICD-10-CM | POA: Insufficient documentation

## 2012-12-27 DIAGNOSIS — R0602 Shortness of breath: Secondary | ICD-10-CM | POA: Insufficient documentation

## 2012-12-27 DIAGNOSIS — R062 Wheezing: Secondary | ICD-10-CM | POA: Insufficient documentation

## 2012-12-27 DIAGNOSIS — M2550 Pain in unspecified joint: Secondary | ICD-10-CM | POA: Insufficient documentation

## 2012-12-27 DIAGNOSIS — Z79899 Other long term (current) drug therapy: Secondary | ICD-10-CM | POA: Insufficient documentation

## 2012-12-27 DIAGNOSIS — E7800 Pure hypercholesterolemia, unspecified: Secondary | ICD-10-CM | POA: Insufficient documentation

## 2012-12-27 DIAGNOSIS — Z8679 Personal history of other diseases of the circulatory system: Secondary | ICD-10-CM | POA: Insufficient documentation

## 2012-12-27 DIAGNOSIS — R05 Cough: Secondary | ICD-10-CM | POA: Insufficient documentation

## 2012-12-27 DIAGNOSIS — R51 Headache: Secondary | ICD-10-CM | POA: Insufficient documentation

## 2012-12-27 DIAGNOSIS — R071 Chest pain on breathing: Secondary | ICD-10-CM | POA: Insufficient documentation

## 2012-12-27 DIAGNOSIS — Z7982 Long term (current) use of aspirin: Secondary | ICD-10-CM | POA: Insufficient documentation

## 2012-12-27 LAB — CBC
HCT: 41 % (ref 36.0–46.0)
MCHC: 34.9 g/dL (ref 30.0–36.0)
WBC: 9 10*3/uL (ref 4.0–10.5)

## 2012-12-27 LAB — POCT I-STAT TROPONIN I: Troponin i, poc: 0 ng/mL (ref 0.00–0.08)

## 2012-12-27 LAB — BASIC METABOLIC PANEL
GFR calc non Af Amer: >90 mL/min (ref >90)
Sodium: 138 mEq/L (ref 135–145)

## 2012-12-27 MED ORDER — HYDROCODONE-ACETAMINOPHEN 5-325 MG PO TABS
1.0000 | ORAL_TABLET | Freq: Once | ORAL | Status: DC
  Filled 2012-12-27: qty 1

## 2012-12-27 NOTE — ED Notes (Signed)
Pt c/o left sided CP with SOB starting last night; pt sts some worsening with movement

## 2012-12-27 NOTE — ED Notes (Signed)
Patient transported to X-ray, will bring back to A3.

## 2012-12-27 NOTE — ED Provider Notes (Signed)
History     CSN: [id number]  Arrival date & time 12/27/12  7829   First MD Initiated Contact with Patient 12/27/12 1056      Chief Complaint  Patient presents with  . Chest Pain   (Consider location/radiation/quality/duration/timing/severity/associated sxs/prior treatment) HPI Comments: [name] is a 47 year old white female with PMH of s/p ascending aortic aneurysm repair and chronic tobacco use presents to the ED today for substernal and left sided chest pain x2 days.  She explains that yesterday morning around 9am while she was at work she had sudden onset of chest pain/pressure, difficulty taking a proper breath, and decreased energy.  The pain was intermittent and would go away when she rests but returns with movement.  She was unable to sleep on her side yesterday due to discomfort.  She did not take anything for the pain and this morning called her primary care doctor to be seen who advised her to come to the the ED.  She reports a high threshold of pain so yesterday she says the pain was maybe 7/10 on pain scale and more pressure like and that today the pain is definitely better and feels more sore.  She still has pain with movement and deep inspiration.  She also has some pain radiating to the back of her left shoulder at times.  She reports an occasional dry cough and was wondering if she had bronchitis, which is why she called her PCP and also endorses an occasional slight headache.  She denies any sore throat, trouble swallowing, nausea, vomiting, light-headedness, abdominal pain, congestion, dysuria, constipation, fever, chills, or any vision disturbances.  Ms. Bring continues to smoke cigarettes, approximately 1/2ppd and follows with Dr. Sw[place] from cardiology.  She did take her daily Asprin 325mg  yesterday and today morning.    Her daughter is present in the room as well and PCP is [facility].    Patient is a 47 y.o. female presenting with chest pain. The history is provided  by the patient. No language interpreter was used.  Chest Pain Pain location:  Substernal area and L chest Pain quality: aching and pressure   Pain radiates to:  L shoulder Pain radiates to the back: no   Pain severity:  Moderate Onset quality:  Sudden Duration:  1 day Timing:  Intermittent Progression:  Improving Chronicity:  New Context comment:  At work Relieved by:  Rest Worsened by:  Movement and certain positions (leaning forward) Ineffective treatments:  None tried Associated symptoms: cough, headache and shortness of breath   Associated symptoms: no abdominal pain, no anxiety, no back pain, no diaphoresis, no dysphagia, no fever, no heartburn, no lower extremity edema, no nausea, no near-syncope, no numbness, no palpitations, no syncope and not vomiting   Cough:    Cough characteristics:  Dry   Severity:  Mild   Onset quality:  Gradual   Duration:  2 days   Timing:  Sporadic   Progression:  Unchanged   Chronicity:  New Shortness of breath:    Severity:  Moderate   Onset quality:  Sudden   Duration:  2 days   Timing:  Intermittent   Progression:  Improving Risk factors: aortic disease, high cholesterol and smoking   Risk factors: no diabetes mellitus   Risk factors comment:  S/p ascending aorta repair 2007  Past Medical History  Diagnosis Date  . Thoracic aortic aneurysm     Ascending. Status post repair in April 2007  . Hypertension   .  Hypercholesterolemia   . Tobacco dependence     Past Surgical History  Procedure Laterality Date  . Descending aortic aneurysm repair  2007    Status post ascending with a #101mm Hemashield tube graft in April 2007.  . Tubal ligation      Family History  Problem Relation Age of Onset  . Hypertension Mother   . Diabetes Brother     History  Substance Use Topics  . Smoking status: Current Every Day Smoker -- 1.00 packs/day  . Smokeless tobacco: Not on file  . Alcohol Use: No    OB History   Grav Para Term Preterm  Abortions TAB SAB Ect Mult Living                  Review of Systems  Constitutional: Negative for fever and diaphoresis.  HENT: Negative for congestion, rhinorrhea and trouble swallowing.   Eyes: Negative.  Negative for visual disturbance.  Respiratory: Positive for cough, chest tightness, shortness of breath and wheezing.   Cardiovascular: Positive for chest pain. Negative for palpitations, leg swelling, syncope and near-syncope.  Gastrointestinal: Negative for heartburn, nausea, vomiting, abdominal pain, diarrhea and constipation.  Endocrine: Negative.   Genitourinary: Negative.  Negative for dysuria and pelvic pain.  Musculoskeletal: Positive for arthralgias. Negative for back pain.  Skin: Negative.   Allergic/Immunologic: Negative.   Neurological: Positive for headaches. Negative for syncope and numbness.  Hematological: Negative.   Psychiatric/Behavioral: Negative.  The patient is not nervous/anxious.    Allergies  Review of patient's allergies indicates no known allergies.  Home Medications   Current Outpatient Rx  Name  Route  Sig  Dispense  Refill  . aspirin 325 MG tablet   Oral   Take 325 mg by mouth daily.           . Omega-3 Fatty Acids (FISH OIL PO)   Oral   Take 2 capsules by mouth daily.          . pravastatin (PRAVACHOL) 40 MG tablet   Oral   Take 1 tablet (40 mg total) by mouth every evening.   30 tablet   6    BP 121/61  Pulse 53  Temp(Src) 98.2 F (36.8 C) (Oral)  Resp 24  Ht 5\' 4"  (1.626 m)  Wt 178 lb (80.74 kg)  BMI 30.54 kg/m2  SpO2 100%  Physical Exam  Constitutional: She appears well-developed and well-nourished. No distress.  HENT:  Head: Normocephalic and atraumatic.  Eyes: EOM are normal. Pupils are equal, round, and reactive to light.  Neck: Normal range of motion. Neck supple.  Cardiovascular: Normal rate, regular rhythm, normal heart sounds and intact distal pulses.   Pulmonary/Chest: Effort normal. She has wheezes.  B/L  expiratory wheezing   ED Course  Procedures (including critical care time)  Labs Reviewed  CBC  BASIC METABOLIC PANEL  POCT I-STAT TROPONIN I  POCT I-STAT TROPONIN I   Dg Chest 2 View  12/27/2012   *RADIOLOGY REPORT*  Clinical Data: Chest pain and shortness of breath  CHEST - 2 VIEW  Comparison: 11/29/2005 prior chest radiographs  Findings: The cardiomediastinal silhouette is unremarkable. Median sternotomy wires are again identified. Mild elevation of the right hemidiaphragm is again noted. There is no evidence of focal airspace disease, pulmonary edema, suspicious pulmonary nodule/mass, pleural effusion, or pneumothorax. No acute bony abnormalities are identified.  IMPRESSION: No evidence of active cardiopulmonary disease.   Original Report Authenticated By: Harmon Pier, M.D.     No diagnosis found.  MDM  [name] is a 47 year old female with PMH of ascending aortic aneurysm repair in 2007 and active smoker presenting with chest pain x2 days.  Pain has improved today, worse with movement, and initially associated with shortness of breath.  Denies prior similar episodes. CXR no evidence of actie cardiopulmonary disease. Troponin i poc x2 negative.  EKG  69bpm NSR.  PERC rule negative.  Possible chest wall pain.  Pain improved during time in ED, refusing any pain medication.  Will need to follow up with pcp and cardiology.   -Discussed case with Dr. Elease Hashimoto from Cardiology on the phone.  He claims the pain does not sound cardiac and more pleuritic in nature given pain with movement, negative initial troponin, and no ischemic changes at this time.   -CBC and BMET wnl -repeat troponin negative -follow up with Dr. [place] (has appointment for July but may be able to be seen sooner) and PCP advised  Case discussed with Dr. Ranae Palms and agrees with plan        Darden Palmer, MD 12/27/12 [phone]

## 2012-12-27 NOTE — ED Notes (Signed)
MD at bedside. 

## 2012-12-27 NOTE — ED Provider Notes (Signed)
I saw and evaluated the patient, reviewed the resident's note and I agree with the findings and plan. Chest pain reproduced with palpation of L chest. Doubt cardiac cause of pain. Discussed with Dr Grayland Jack who agrees.   Loren Racer, MD 12/27/12 1558

## 2013-02-05 ENCOUNTER — Ambulatory Visit (INDEPENDENT_AMBULATORY_CARE_PROVIDER_SITE_OTHER): Payer: BC Managed Care – PPO | Admitting: Cardiology

## 2013-02-05 ENCOUNTER — Encounter: Payer: Self-pay | Admitting: Cardiology

## 2013-02-05 VITALS — BP 124/76 | HR 56 | Ht 64.0 in | Wt 172.0 lb

## 2013-02-05 DIAGNOSIS — F17200 Nicotine dependence, unspecified, uncomplicated: Secondary | ICD-10-CM

## 2013-02-05 DIAGNOSIS — I10 Essential (primary) hypertension: Secondary | ICD-10-CM

## 2013-02-05 DIAGNOSIS — Z8679 Personal history of other diseases of the circulatory system: Secondary | ICD-10-CM

## 2013-02-05 DIAGNOSIS — Z9889 Other specified postprocedural states: Secondary | ICD-10-CM

## 2013-02-05 DIAGNOSIS — E7800 Pure hypercholesterolemia, unspecified: Secondary | ICD-10-CM

## 2013-02-05 DIAGNOSIS — Z720 Tobacco use: Secondary | ICD-10-CM

## 2013-02-05 NOTE — Progress Notes (Signed)
[name] Date of Birth: 04-13-1966 Medical Record #[id number]  History of Present Illness: April [name] is seen today for followup. She status post thoracic aneurysm repair in 2007. 3 weeks ago she was seen in the emergency department with acute chest pain. This is felt to be musculoskeletal. Evaluation with blood work, ECG, and chest x-ray were unremarkable. After 3-4 days her chest discomfort resolved. She feels well followup today. She continues to smoke. She has her lab work checked by her primary care.  Current Outpatient Prescriptions on File Prior to Visit  Medication Sig Dispense Refill  . aspirin 325 MG tablet Take 325 mg by mouth daily.        . Omega-3 Fatty Acids (FISH OIL PO) Take 2 capsules by mouth daily.       . pravastatin (PRAVACHOL) 40 MG tablet Take 1 tablet (40 mg total) by mouth every evening.  30 tablet  6   No current facility-administered medications on file prior to visit.    No Known Allergies  Past Medical History  Diagnosis Date  . Thoracic aortic aneurysm     Ascending. Status post repair in April 2007  . Hypertension   . Hypercholesterolemia   . Tobacco dependence     Past Surgical History  Procedure Laterality Date  . Descending aortic aneurysm repair  2007    Status post ascending with a #87mm Hemashield tube graft in April 2007.  . Tubal ligation      History  Smoking status  . Current Every Day Smoker -- 1.00 packs/day  Smokeless tobacco  . Not on file    History  Alcohol Use No    Family History  Problem Relation Age of Onset  . Hypertension Mother   . Diabetes Brother     Review of Systems: As noted in history of present illness.  All other systems were reviewed and are negative.  Physical Exam: BP 124/76  Pulse 56  Ht 5\' 4"  (1.626 m)  Wt 172 lb (78.019 kg)  BMI 29.51 kg/m2  SpO2 96% The patient is alert and oriented x 3.   The HEENT exam is unremarkable.  The carotids are 2+ without bruits.  There is no  thyromegaly.  There is no JVD.  The lungs reveal scattered expiratory squeaks.  The chest wall is non tender.  The heart exam reveals a regular rate with a normal S1 and S2.  There are no murmurs, gallops, or rubs.  The PMI is not displaced.   Abdominal exam reveals good bowel sounds. Exam of the legs reveal no clubbing, cyanosis, or edema.  The legs are without rashes.  The distal pulses are intact.  Cranial nerves II - XII are intact.  Motor and sensory functions are intact.  The gait is normal.  LABORATORY DATA: Lab Results  Component Value Date   WBC 9.0 12/27/2012   HGB 14.3 12/27/2012   HCT 41.0 12/27/2012   PLT 238 12/27/2012   GLUCOSE 84 12/27/2012   CHOL 146 12/15/2011   TRIG 50.0 12/15/2011   HDL 48.30 12/15/2011   LDLCALC 88 12/15/2011   ALT 14 12/15/2011   AST 18 12/15/2011   NA 138 12/27/2012   K 3.8 12/27/2012   CL 104 12/27/2012   CREATININE 0.67 12/27/2012   BUN 15 12/27/2012   CO2 23 12/27/2012   ECG on 12/27/2012 was normal. Chest x-ray was also normal.  Assessment / Plan: 1. Thoracic aortic aneurysm status post repair in 2007. Patient is asymptomatic.  Continue risk factor modification.  2. Hypercholesterolemia.  3. Tobacco abuse. Encouraged smoking cessation.  4. Chest wall pain. Resolved.

## 2013-02-05 NOTE — Patient Instructions (Signed)
Continue your current therapy  Stop smoking!!!  I will see you in 6 months. 

## 2013-07-22 ENCOUNTER — Ambulatory Visit: Payer: BC Managed Care – PPO | Admitting: Cardiology

## 2013-08-28 ENCOUNTER — Ambulatory Visit (INDEPENDENT_AMBULATORY_CARE_PROVIDER_SITE_OTHER): Payer: BC Managed Care – PPO | Admitting: Cardiology

## 2013-08-28 ENCOUNTER — Encounter: Payer: Self-pay | Admitting: Cardiology

## 2013-08-28 VITALS — BP 124/82 | HR 77 | Wt 181.0 lb

## 2013-08-28 DIAGNOSIS — E7800 Pure hypercholesterolemia, unspecified: Secondary | ICD-10-CM

## 2013-08-28 DIAGNOSIS — F17200 Nicotine dependence, unspecified, uncomplicated: Secondary | ICD-10-CM

## 2013-08-28 DIAGNOSIS — I10 Essential (primary) hypertension: Secondary | ICD-10-CM

## 2013-08-28 DIAGNOSIS — Z720 Tobacco use: Secondary | ICD-10-CM

## 2013-08-28 LAB — CBC WITH DIFFERENTIAL/PLATELET
Basophils Absolute: 0 10*3/uL (ref 0.0–0.1)
Basophils Relative: 0.6 % (ref 0.0–3.0)
EOS ABS: 0.1 10*3/uL (ref 0.0–0.7)
Eosinophils Relative: 1.1 % (ref 0.0–5.0)
HCT: 42.4 % (ref 36.0–46.0)
Hemoglobin: 14 g/dL (ref 12.0–15.0)
Lymphocytes Relative: 19.2 % (ref 12.0–46.0)
Lymphs Abs: 1.2 10*3/uL (ref 0.7–4.0)
MCHC: 33 g/dL (ref 30.0–36.0)
MCV: 96.8 fl (ref 78.0–100.0)
MONO ABS: 0.6 10*3/uL (ref 0.1–1.0)
Monocytes Relative: 9.9 % (ref 3.0–12.0)
NEUTROS PCT: 69.2 % (ref 43.0–77.0)
Neutro Abs: 4.5 10*3/uL (ref 1.4–7.7)
PLATELETS: 202 10*3/uL (ref 150.0–400.0)
RBC: 4.38 Mil/uL (ref 3.87–5.11)
RDW: 12.5 % (ref 11.5–14.6)
WBC: 6.5 10*3/uL (ref 4.5–10.5)

## 2013-08-28 LAB — LIPID PANEL
CHOLESTEROL: 174 mg/dL (ref 0–200)
HDL: 49.4 mg/dL (ref >39.00)
LDL Cholesterol: 113 mg/dL — ABNORMAL HIGH (ref 0–99)
Total CHOL/HDL Ratio: 4
Triglycerides: 57 mg/dL (ref 0.0–149.0)
VLDL: 11.4 mg/dL (ref 0.0–40.0)

## 2013-08-28 LAB — HEPATIC FUNCTION PANEL
ALBUMIN: 3.8 g/dL (ref 3.5–5.2)
ALK PHOS: 61 U/L (ref 39–117)
ALT: 20 U/L (ref 0–35)
AST: 22 U/L (ref 0–37)
Bilirubin, Direct: 0 mg/dL (ref 0.0–0.3)
Total Bilirubin: 0.7 mg/dL (ref 0.3–1.2)
Total Protein: 7.7 g/dL (ref 6.0–8.3)

## 2013-08-28 LAB — BASIC METABOLIC PANEL
BUN: 22 mg/dL (ref 6–23)
CALCIUM: 9.3 mg/dL (ref 8.4–10.5)
CO2: 26 mEq/L (ref 19–32)
Chloride: 106 mEq/L (ref 96–112)
Creatinine, Ser: 0.7 mg/dL (ref 0.4–1.2)
GFR: 103.44 mL/min (ref >60.00)
Glucose, Bld: 87 mg/dL (ref 70–99)
POTASSIUM: 4 meq/L (ref 3.5–5.1)
SODIUM: 140 meq/L (ref 135–145)

## 2013-08-28 NOTE — Patient Instructions (Signed)
Stop Smoking   Lab Work Today ( cbc,lipid,hepatic )   Your physician wants you to follow-up in: 6 months. You will receive a reminder letter in the mail two months in advance. If you don't receive a letter, please call our office to schedule the follow-up appointment.

## 2013-08-28 NOTE — Progress Notes (Signed)
   April [name] Date of Birth: [birth year] Medical Record #[id number]  History of Present Illness: April [name] is seen today for followup. She status post thoracic aneurysm repair in 2007.  She feels well followup today. She continues to smoke but is smoking less since she can no longer smoke at work. She has gained 9 lbs. No chest pain other than mild chest wall pain.  Current Outpatient Prescriptions on File Prior to Visit  Medication Sig Dispense Refill  . aspirin 325 MG tablet Take 325 mg by mouth daily.        . Omega-3 Fatty Acids (FISH OIL PO) Take 2 capsules by mouth daily.       . pravastatin (PRAVACHOL) 40 MG tablet Take 1 tablet (40 mg total) by mouth every evening.  30 tablet  6   No current facility-administered medications on file prior to visit.    No Known Allergies  Past Medical History  Diagnosis Date  . Thoracic aortic aneurysm     Ascending. Status post repair in April 2007  . Hypertension   . Hypercholesterolemia   . Tobacco dependence     Past Surgical History  Procedure Laterality Date  . Descending aortic aneurysm repair  2007    Status post ascending with a #6528mm Hemashield tube graft in April 2007.  . Tubal ligation      History  Smoking status  . Current Every Day Smoker -- 1.00 packs/day  Smokeless tobacco  . Not on file    History  Alcohol Use No    Family History  Problem Relation Age of Onset  . Hypertension Mother   . Diabetes Brother     Review of Systems: As noted in history of present illness.  All other systems were reviewed and are negative.  Physical Exam: BP 124/82  Pulse 77  Wt 181 lb (82.101 kg) The patient is alert and oriented x 3.   The HEENT exam is unremarkable.  The carotids are 2+ without bruits.  There is no thyromegaly.  There is no JVD.  The lungs are clear.  The chest wall is non tender.  The heart exam reveals a regular rate with a normal S1 and S2.  There are no murmurs, gallops, or rubs.  The PMI is not  displaced.   Abdominal exam reveals good bowel sounds. Exam of the legs reveal no clubbing, cyanosis, or edema.  The legs are without rashes.  The distal pulses are intact.  Cranial nerves II - XII are intact.  Motor and sensory functions are intact.  The gait is normal.  LABORATORY DATA: Lab Results  Component Value Date   WBC 9.0 12/27/2012   HGB 14.3 12/27/2012   HCT 41.0 12/27/2012   PLT 238 12/27/2012   GLUCOSE 84 12/27/2012   CHOL 146 12/15/2011   TRIG 50.0 12/15/2011   HDL 48.30 12/15/2011   LDLCALC 88 12/15/2011   ALT 14 12/15/2011   AST 18 12/15/2011   NA 138 12/27/2012   K 3.8 12/27/2012   CL 104 12/27/2012   CREATININE 0.67 12/27/2012   BUN 15 12/27/2012   CO2 23 12/27/2012     Assessment / Plan: 1. Thoracic aortic aneurysm status post repair in 2007. Patient is asymptomatic. Continue risk factor modification.  2. Hypercholesterolemia. Will check fasting lab work today.  3. Tobacco abuse. Encouraged complete smoking cessation.  4. Chest wall pain. Resolved.

## 2014-03-27 ENCOUNTER — Encounter: Payer: Self-pay | Admitting: Cardiology

## 2014-03-27 ENCOUNTER — Ambulatory Visit (INDEPENDENT_AMBULATORY_CARE_PROVIDER_SITE_OTHER): Payer: BC Managed Care – PPO | Admitting: Cardiology

## 2014-03-27 VITALS — BP 110/70 | HR 77 | Ht 64.0 in | Wt 183.0 lb

## 2014-03-27 DIAGNOSIS — Z9889 Other specified postprocedural states: Secondary | ICD-10-CM

## 2014-03-27 DIAGNOSIS — I10 Essential (primary) hypertension: Secondary | ICD-10-CM

## 2014-03-27 DIAGNOSIS — Z8679 Personal history of other diseases of the circulatory system: Secondary | ICD-10-CM

## 2014-03-27 DIAGNOSIS — Z720 Tobacco use: Secondary | ICD-10-CM

## 2014-03-27 DIAGNOSIS — E7800 Pure hypercholesterolemia, unspecified: Secondary | ICD-10-CM

## 2014-03-27 DIAGNOSIS — F17200 Nicotine dependence, unspecified, uncomplicated: Secondary | ICD-10-CM

## 2014-03-27 NOTE — Progress Notes (Signed)
   April [name] Date of Birth: Dec 26, 1965 Medical Record #[id number]  History of Present Illness: April [name] is seen today for followup. She status post thoracic aneurysm repair in 2007.  She feels well followup today. She continues to smoke.  No chest pain other than mild chest wall pain or indigestion. States she stays busy but doesn't really exercise.  Current Outpatient Prescriptions on File Prior to Visit  Medication Sig Dispense Refill  . aspirin 325 MG tablet Take 325 mg by mouth daily.        . Omega-3 Fatty Acids (FISH OIL PO) Take 2 capsules by mouth daily.        No current facility-administered medications on file prior to visit.  Pravastatin 80 mg daily.  No Known Allergies  Past Medical History  Diagnosis Date  . Thoracic aortic aneurysm     Ascending. Status post repair in April 2007  . Hypertension   . Hypercholesterolemia   . Tobacco dependence     Past Surgical History  Procedure Laterality Date  . Descending aortic aneurysm repair  2007    Status post ascending with a #3mm Hemashield tube graft in April 2007.  . Tubal ligation      History  Smoking status  . Current Every Day Smoker -- 1.00 packs/day  Smokeless tobacco  . Not on file    History  Alcohol Use No    Family History  Problem Relation Age of Onset  . Hypertension Mother   . Diabetes Brother     Review of Systems: As noted in history of present illness.  All other systems were reviewed and are negative.  Physical Exam: BP 110/70  Pulse 77  Ht  (1.626 m)  Wt 183 lb (83.008 kg)  BMI 31.40 kg/m2 The patient is alert and oriented x 3.   The HEENT exam is unremarkable.  The carotids are 2+ without bruits.  There is no thyromegaly.  There is no JVD.  The lungs are clear.  The chest wall is non tender.  The heart exam reveals a regular rate with a normal S1 and S2.  There are no murmurs, gallops, or rubs.  The PMI is not displaced.   Abdominal exam reveals good bowel sounds. Exam  of the legs reveal no clubbing, cyanosis, or edema.   The distal pulses are intact.  Cranial nerves II - XII are intact.   The gait is normal.  LABORATORY DATA: Lab Results  Component Value Date   WBC 6.5 08/28/2013   HGB 14.0 08/28/2013   HCT 42.4 08/28/2013   PLT 202.0 08/28/2013   GLUCOSE 87 08/28/2013   CHOL 174 08/28/2013   TRIG 57.0 08/28/2013   HDL 49.40 08/28/2013   LDLCALC 113* 08/28/2013   ALT 20 08/28/2013   AST 22 08/28/2013   NA 140 08/28/2013   K 4.0 08/28/2013   CL 106 08/28/2013   CREATININE 0.7 08/28/2013   BUN 22 08/28/2013   CO2 26 08/28/2013   Ecg: NSR rate 77 bpm. Normal.  Assessment / Plan: 1. Thoracic aortic aneurysm status post repair in 2007. Patient is asymptomatic. Continue risk factor modification.  2. Hypercholesterolemia. Patient had fasting lab work with Dr. Lysbeth Galas today.  3. Tobacco abuse. Encouraged complete smoking cessation.

## 2014-03-27 NOTE — Patient Instructions (Signed)
Continue your current therapy  Quit smoking  I will see you in 6 months. 

## 2014-10-27 ENCOUNTER — Encounter: Payer: Self-pay | Admitting: Cardiology

## 2014-10-27 ENCOUNTER — Ambulatory Visit (INDEPENDENT_AMBULATORY_CARE_PROVIDER_SITE_OTHER): Payer: BLUE CROSS/BLUE SHIELD | Admitting: Cardiology

## 2014-10-27 VITALS — BP 106/80 | HR 70 | Ht 64.0 in | Wt 188.0 lb

## 2014-10-27 DIAGNOSIS — Z9889 Other specified postprocedural states: Secondary | ICD-10-CM

## 2014-10-27 DIAGNOSIS — Z8679 Personal history of other diseases of the circulatory system: Secondary | ICD-10-CM

## 2014-10-27 DIAGNOSIS — E7800 Pure hypercholesterolemia, unspecified: Secondary | ICD-10-CM

## 2014-10-27 DIAGNOSIS — Z720 Tobacco use: Secondary | ICD-10-CM | POA: Diagnosis not present

## 2014-10-27 DIAGNOSIS — I10 Essential (primary) hypertension: Secondary | ICD-10-CM

## 2014-10-27 MED ORDER — ROSUVASTATIN CALCIUM 20 MG PO TABS: 20.0000 mg | ORAL_TABLET | Freq: Every day | ORAL | Status: DC

## 2014-10-27 NOTE — Patient Instructions (Signed)
Stop smoking  Increase crestor to 20 mg daily  You need to get moving-- more exercise!!  Follow up in 6 months/

## 2014-10-27 NOTE — Progress Notes (Signed)
April [name] Date of Birth: 04-May-1966 Medical Record #[id number]  History of Present Illness: April [name] is seen today for followup thoracic aneurysm. She status post thoracic aneurysm repair in 2007 by Dr. Laneta SimmersBartle. No CAD at that time.  She feels well followup today. She continues to smoke. She is afraid to quit and gain weigth.  No chest pain. States she stays busy but doesn't really exercise.    Medication List       This list is accurate as of: 10/27/14 12:18 PM.  Always use your most recent med list.               aspirin 325 MG tablet  Take 325 mg by mouth daily.     FISH OIL PO  Take 2 capsules by mouth daily.     rosuvastatin 20 MG tablet  Commonly known as:  CRESTOR  Take 1 tablet (20 mg total) by mouth daily.        No Known Allergies  Past Medical History  Diagnosis Date  . Thoracic aortic aneurysm     Ascending. Status post repair in April 2007  . Hypertension   . Hypercholesterolemia   . Tobacco dependence     Past Surgical History  Procedure Laterality Date  . Descending aortic aneurysm repair  2007    Status post ascending with a #5728mm Hemashield tube graft in April 2007.  . Tubal ligation      History  Smoking status  . Current Every Day Smoker -- 1.00 packs/day  Smokeless tobacco  . Not on file    History  Alcohol Use No    Family History  Problem Relation Age of Onset  . Hypertension Mother   . Diabetes Brother     Review of Systems: As noted in history of present illness.  All other systems were reviewed and are negative.  Physical Exam: BP 106/80 mmHg  Pulse 70  Ht 5\' 4"  (1.626 m)  Wt 188 lb (85.276 kg)  BMI 32.25 kg/m2 The patient is alert and oriented x 3.   The HEENT exam is unremarkable.  The carotids are 2+ without bruits.  There is no thyromegaly.  There is no JVD.  The lungs are clear.  The chest wall is non tender.  The heart exam reveals a regular rate with a normal S1 and S2.  There are no murmurs, gallops, or  rubs.  The PMI is not displaced.   Abdominal exam reveals good bowel sounds. Exam of the legs reveal no clubbing, cyanosis, or edema.   The distal pulses are intact.  Cranial nerves II - XII are intact.   The gait is normal.  LABORATORY DATA: Lab Results  Component Value Date   WBC 6.5 08/28/2013   HGB 14.0 08/28/2013   HCT 42.4 08/28/2013   PLT 202.0 08/28/2013   GLUCOSE 87 08/28/2013   CHOL 174 08/28/2013   TRIG 57.0 08/28/2013   HDL 49.40 08/28/2013   LDLCALC 113* 08/28/2013   ALT 20 08/28/2013   AST 22 08/28/2013   NA 140 08/28/2013   K 4.0 08/28/2013   CL 106 08/28/2013   CREATININE 0.7 08/28/2013   BUN 22 08/28/2013   CO2 26 08/28/2013   Labs dated 10/23/14: cholesterol 182, trig-73, HDL-55, LDL 112. LFTs were normal.   Assessment / Plan: 1. Thoracic aortic aneurysm status post repair in 2007. Patient is asymptomatic. Continue risk factor modification.  2. Hypercholesterolemia. Goal LDL is less than 100. Will increase Crestor to  20 mg daily.   3. Tobacco abuse. Encouraged complete smoking cessation.  4. Obesity. Acknowledged that she may gain weight with smoking cessation but that smoking cessation is our primary goal and we can always work on the weight later. Needs to significantly increase aerobic activity.

## 2014-10-28 ENCOUNTER — Encounter: Payer: Self-pay | Admitting: Cardiology

## 2015-01-28 ENCOUNTER — Other Ambulatory Visit: Payer: Self-pay | Admitting: *Deleted

## 2015-01-28 MED ORDER — ROSUVASTATIN CALCIUM 20 MG PO TABS: 20.0000 mg | ORAL_TABLET | Freq: Every day | ORAL | Status: DC

## 2015-04-21 ENCOUNTER — Other Ambulatory Visit: Payer: Self-pay

## 2015-04-23 ENCOUNTER — Encounter: Payer: Self-pay | Admitting: Cardiology

## 2015-04-23 ENCOUNTER — Ambulatory Visit (INDEPENDENT_AMBULATORY_CARE_PROVIDER_SITE_OTHER): Payer: BLUE CROSS/BLUE SHIELD | Admitting: Cardiology

## 2015-04-23 VITALS — BP 120/70 | HR 64 | Ht 64.0 in | Wt 185.4 lb

## 2015-04-23 DIAGNOSIS — I10 Essential (primary) hypertension: Secondary | ICD-10-CM | POA: Diagnosis not present

## 2015-04-23 DIAGNOSIS — E7800 Pure hypercholesterolemia, unspecified: Secondary | ICD-10-CM

## 2015-04-23 DIAGNOSIS — Z720 Tobacco use: Secondary | ICD-10-CM

## 2015-04-23 DIAGNOSIS — Z9889 Other specified postprocedural states: Secondary | ICD-10-CM

## 2015-04-23 LAB — CBC WITH DIFFERENTIAL/PLATELET
BASOS PCT: 1 % (ref 0–1)
Basophils Absolute: 0.1 10*3/uL (ref 0.0–0.1)
EOS ABS: 0.1 10*3/uL (ref 0.0–0.7)
EOS PCT: 1 % (ref 0–5)
HCT: 43 % (ref 36.0–46.0)
HEMOGLOBIN: 14.6 g/dL (ref 12.0–15.0)
Lymphocytes Relative: 30 % (ref 12–46)
Lymphs Abs: 2.5 10*3/uL (ref 0.7–4.0)
MCH: 32.2 pg (ref 26.0–34.0)
MCHC: 34 g/dL (ref 30.0–36.0)
MCV: 94.7 fL (ref 78.0–100.0)
MONO ABS: 0.6 10*3/uL (ref 0.1–1.0)
MONOS PCT: 7 % (ref 3–12)
MPV: 10.8 fL (ref 8.6–12.4)
Neutro Abs: 5.1 10*3/uL (ref 1.7–7.7)
Neutrophils Relative %: 61 % (ref 43–77)
Platelets: 262 10*3/uL (ref 150–400)
RBC: 4.54 MIL/uL (ref 3.87–5.11)
RDW: 12.5 % (ref 11.5–15.5)
WBC: 8.4 10*3/uL (ref 4.0–10.5)

## 2015-04-23 LAB — HEPATIC FUNCTION PANEL
ALBUMIN: 4.1 g/dL (ref 3.6–5.1)
ALK PHOS: 64 U/L (ref 33–115)
ALT: 17 U/L (ref 6–29)
AST: 20 U/L (ref 10–35)
Bilirubin, Direct: 0.1 mg/dL (ref <=0.2)
Indirect Bilirubin: 0.2 mg/dL (ref 0.2–1.2)
TOTAL PROTEIN: 7.2 g/dL (ref 6.1–8.1)
Total Bilirubin: 0.3 mg/dL (ref 0.2–1.2)

## 2015-04-23 LAB — BASIC METABOLIC PANEL
BUN: 16 mg/dL (ref 7–25)
CALCIUM: 9.5 mg/dL (ref 8.6–10.2)
CO2: 29 mmol/L (ref 20–31)
CREATININE: 0.61 mg/dL (ref 0.50–1.10)
Chloride: 106 mmol/L (ref 98–110)
Glucose, Bld: 90 mg/dL (ref 65–99)
Potassium: 4.8 mmol/L (ref 3.5–5.3)
SODIUM: 143 mmol/L (ref 135–146)

## 2015-04-23 LAB — LIPID PANEL
Cholesterol: 158 mg/dL (ref 125–200)
HDL: 47 mg/dL (ref >=46)
LDL Cholesterol: 97 mg/dL (ref <130)
TRIGLYCERIDES: 72 mg/dL (ref <150)
Total CHOL/HDL Ratio: 3.4 Ratio (ref <=5.0)
VLDL: 14 mg/dL (ref <30)

## 2015-04-23 NOTE — Progress Notes (Signed)
April [name] Date of Birth: [birth year] Medical Record #[id number]  History of Present Illness: April [name] is seen today for followup thoracic aneurysm. She status post thoracic aneurysm repair in 2007 by Dr. Laneta Simmers. No CAD at that time.  On follow up today she reports a 2-3 week history of a lot of indigestion. This is a burning mid sternal pain without radiation. Worse after drinking soda or flavored water. She is still smoking 1/2 pk/day. No other complaints.     Medication List       This list is accurate as of: 04/23/15  7:26 PM.  Always use your most recent med list.               aspirin 325 MG tablet  Take 325 mg by mouth daily.     Fish Oil 1000 MG Caps  Take 2 capsules by mouth.     PRILOSEC 20 MG capsule  Generic drug:  omeprazole  Take 20 mg by mouth.     rosuvastatin 20 MG tablet  Commonly known as:  CRESTOR  Take 1 tablet (20 mg total) by mouth daily.        No Known Allergies  Past Medical History  Diagnosis Date  . Thoracic aortic aneurysm (HCC)     Ascending. Status post repair in April 2007  . Hypertension   . Hypercholesterolemia   . Tobacco dependence     Past Surgical History  Procedure Laterality Date  . Descending aortic aneurysm repair  2007    Status post ascending with a #46mm Hemashield tube graft in April 2007.  . Tubal ligation      History  Smoking status  . Current Every Day Smoker -- 1.00 packs/day  Smokeless tobacco  . Not on file    History  Alcohol Use No    Family History  Problem Relation Age of Onset  . Hypertension Mother   . Diabetes Brother     Review of Systems: As noted in history of present illness.  All other systems were reviewed and are negative.  Physical Exam: BP 120/70 mmHg  Pulse 64  Ht  (1.626 m)  Wt 84.097 kg (185 lb 6.4 oz)  BMI 31.81 kg/m2 The patient is alert and oriented x 3.   The HEENT exam is unremarkable.  The carotids are 2+ without bruits.  There is no thyromegaly.  There  is no JVD.  The lungs are clear.  The chest wall is non tender.  The heart exam reveals a regular rate with a normal S1 and S2.  There are no murmurs, gallops, or rubs.  The PMI is not displaced.   Abdominal exam reveals good bowel sounds. Exam of the legs reveal no clubbing, cyanosis, or edema.   The distal pulses are intact.  Cranial nerves II - XII are intact.   The gait is normal.  LABORATORY DATA: Lab Results  Component Value Date   WBC 6.5 08/28/2013   HGB 14.0 08/28/2013   HCT 42.4 08/28/2013   PLT 202.0 08/28/2013   GLUCOSE 87 08/28/2013   CHOL 174 08/28/2013   TRIG 57.0 08/28/2013   HDL 49.40 08/28/2013   LDLCALC 113* 08/28/2013   ALT 20 08/28/2013   AST 22 08/28/2013   NA 140 08/28/2013   K 4.0 08/28/2013   CL 106 08/28/2013   CREATININE 0.7 08/28/2013   BUN 22 08/28/2013   CO2 26 08/28/2013   Ecg today shows NSR with rate 64. Normal Ecg. I  have personally reviewed and interpreted this study.   Assessment / Plan: 1. Thoracic aortic aneurysm status post repair in 2007. Patient is asymptomatic. Continue risk factor modification. I have recommended a follow up CT angiogram of her aorta.  2. Hypercholesterolemia. Goal LDL is less than 100. Will check fasting lab work today on [employer].  3. Tobacco abuse. Encouraged complete smoking cessation.  4. Obesity.   5. Chest pain. Symptoms consistent with GERD. Recommend a trial of OTC Prilosec.

## 2015-04-23 NOTE — Patient Instructions (Signed)
We will check fasting labs today  We will schedule you for CT of the aorta  Take OTC prilosec for your indigestion

## 2015-04-28 ENCOUNTER — Telehealth: Payer: Self-pay | Admitting: Cardiology

## 2015-04-28 NOTE — Telephone Encounter (Signed)
Returned call to patient lab results given. 

## 2015-04-28 NOTE — Telephone Encounter (Signed)
Pt would like her lab results from last Thursday(04-23-15)please.

## 2015-05-01 ENCOUNTER — Ambulatory Visit (HOSPITAL_COMMUNITY)
Admission: RE | Admit: 2015-05-01 | Discharge: 2015-05-01 | Disposition: A | Discharge status: Home / Self Care | Payer: BLUE CROSS/BLUE SHIELD | Source: Ambulatory Visit | Attending: Cardiology | Admitting: Cardiology

## 2015-05-01 ENCOUNTER — Encounter (HOSPITAL_COMMUNITY): Payer: Self-pay

## 2015-05-01 DIAGNOSIS — R0789 Other chest pain: Secondary | ICD-10-CM | POA: Diagnosis not present

## 2015-05-01 DIAGNOSIS — Z95828 Presence of other vascular implants and grafts: Secondary | ICD-10-CM | POA: Insufficient documentation

## 2015-05-01 DIAGNOSIS — Z8679 Personal history of other diseases of the circulatory system: Secondary | ICD-10-CM | POA: Diagnosis not present

## 2015-05-01 DIAGNOSIS — Z720 Tobacco use: Secondary | ICD-10-CM | POA: Diagnosis not present

## 2015-05-01 DIAGNOSIS — Z9889 Other specified postprocedural states: Secondary | ICD-10-CM

## 2015-05-01 DIAGNOSIS — Z48812 Encounter for surgical aftercare following surgery on the circulatory system: Secondary | ICD-10-CM | POA: Insufficient documentation

## 2015-05-01 DIAGNOSIS — R918 Other nonspecific abnormal finding of lung field: Secondary | ICD-10-CM | POA: Insufficient documentation

## 2015-05-01 DIAGNOSIS — I10 Essential (primary) hypertension: Secondary | ICD-10-CM | POA: Insufficient documentation

## 2015-05-01 DIAGNOSIS — E7800 Pure hypercholesterolemia, unspecified: Secondary | ICD-10-CM

## 2015-05-01 MED ORDER — IOHEXOL 350 MG/ML SOLN
100.0000 mL | Freq: Once | INTRAVENOUS | Status: AC | PRN
  Administered 2015-05-01: 100 mL via INTRAVENOUS

## 2016-01-04 ENCOUNTER — Other Ambulatory Visit: Payer: Self-pay | Admitting: Adult Health Nurse Practitioner

## 2016-01-04 DIAGNOSIS — Z1231 Encounter for screening mammogram for malignant neoplasm of breast: Secondary | ICD-10-CM

## 2016-01-11 ENCOUNTER — Ambulatory Visit: Payer: BLUE CROSS/BLUE SHIELD

## 2016-01-28 ENCOUNTER — Encounter: Payer: Self-pay | Admitting: Adult Health Nurse Practitioner

## 2016-02-29 ENCOUNTER — Other Ambulatory Visit: Payer: Self-pay | Admitting: Cardiology

## 2016-02-29 NOTE — Telephone Encounter (Signed)
Rx(s) sent to pharmacy electronically.  

## 2016-04-24 NOTE — Progress Notes (Signed)
April [name] Date of Birth: Dec 20, 1965 Medical Record #[id number]  History of Present Illness: April [name] is seen today for followup thoracic aneurysm. She status post thoracic aneurysm repair in 2007 by Dr. Laneta Simmers. No CAD at that time.  On follow up today she reports she is doing well.  She has chronic right parasternal discomfort at times. She is still smoking 1/2 pk/day. No other complaints.     Medication List       Accurate as of 04/25/16  2:58 PM. Always use your most recent med list.          aspirin 325 MG tablet Take 325 mg by mouth daily.   Fish Oil 1000 MG Caps Take 2 capsules by mouth.   omeprazole 20 MG capsule Commonly known as:  PRILOSEC Take 20 mg by mouth daily.   rosuvastatin 20 MG tablet Commonly known as:  CRESTOR TAKE ONE TABLET BY MOUTH ONCE DAILY       No Known Allergies  Past Medical History:  Diagnosis Date  . Hypercholesterolemia   . Hypertension   . Thoracic aortic aneurysm (HCC)    Ascending. Status post repair in April 2007  . Tobacco dependence     Past Surgical History:  Procedure Laterality Date  . DESCENDING AORTIC ANEURYSM REPAIR  2007   Status post ascending with a #79mm Hemashield tube graft in April 2007.  . TUBAL LIGATION      History  Smoking Status  . Current Every Day Smoker  . Packs/day: 1.00  Smokeless Tobacco  . Not on file    History  Alcohol Use No    Family History  Problem Relation Age of Onset  . Hypertension Mother   . Diabetes Brother     Review of Systems: As noted in history of present illness.  All other systems were reviewed and are negative.  Physical Exam: BP 126/86   Pulse 85   Ht 5\' 4"  (1.626 m)   Wt 184 lb (83.5 kg)   BMI 31.58 kg/m  The patient is alert and oriented x 3.   The HEENT exam is unremarkable.  The carotids are 2+ without bruits.  There is no thyromegaly.  There is no JVD.  The lungs are clear.  The chest wall is non tender.  The heart exam reveals a regular rate  with a normal S1 and S2.  There are no murmurs, gallops, or rubs.  The PMI is not displaced.   Abdominal exam reveals good bowel sounds. Exam of the legs reveal no clubbing, cyanosis, or edema.   The distal pulses are intact.  Cranial nerves II - XII are intact.   The gait is normal.  LABORATORY DATA: Lab Results  Component Value Date   WBC 8.4 04/23/2015   HGB 14.6 04/23/2015   HCT 43.0 04/23/2015   PLT 262 04/23/2015   GLUCOSE 90 04/23/2015   CHOL 158 04/23/2015   TRIG 72 04/23/2015   HDL 47 04/23/2015   LDLCALC 97 04/23/2015   ALT 17 04/23/2015   AST 20 04/23/2015   NA 143 04/23/2015   K 4.8 04/23/2015   CL 106 04/23/2015   CREATININE 0.61 04/23/2015   BUN 16 04/23/2015   CO2 29 04/23/2015   Labs dated 04/21/16: Normal chemistries and CBC. Cholesterol 172, triglycerides 95, HDL 53, LDL 100.   Ecg today shows NSR with rate 64 Nonspecific ST abnormality. I have personally reviewed and interpreted this study.  CT chest 10/14/`/16:CT ANGIOGRAPHY CHEST WITH CONTRAST  TECHNIQUE: Multidetector CT imaging of the chest was performed using the standard protocol during bolus administration of intravenous contrast. Multiplanar CT image reconstructions and MIPs were obtained to evaluate the vascular anatomy.  CONTRAST:  100mL OMNIPAQUE IOHEXOL 350 MG/ML SOLN  COMPARISON:  Two-view chest x-ray 12/27/2012. CT of the chest with contrast 08/15/2006.  FINDINGS: The heart size is normal. The patient is status post knee tension anatomy. Coronary artery calcifications are present. There is no significant pleural or pericardial effusion.  Atherosclerotic calcifications are present at the origin of the left subclavian artery. The aortic arch is otherwise unremarkable. There is no significant thoracic aorta aneurysm. Pulmonary arteries are intact. There is no significant emboli.  No significant mediastinal or axillary adenopathy is present.  Mild diffuse ground-glass attenuation  is present throughout both lungs. This was not present on the prior study.  Bone windows are unremarkable. No focal lytic or blastic lesions are present. Vertebral body heights are maintained.  Review of the MIP images confirms the above findings.  IMPRESSION: 1. No evidence for thoracic aortic aneurysm or dissection. 2. The pulmonary arteries are within normal limits. 3. Scattered ground-glass attenuation throughout both lungs. This may represent scattered atelectasis or mild edema.   Electronically Signed   By: Marin Robertshristopher  Mattern M.D.   On: 05/01/2015 14:45  Assessment / Plan: 1. Thoracic aortic aneurysm status post repair in 2007. Patient is asymptomatic. CT in October 2016 showed good result. Continue risk factor modification.  2. Hypercholesterolemia. Goal LDL is less than 100. Continue Crestor 20 mg daily.   3. Tobacco abuse. Encouraged complete smoking cessation.  4. Obesity.   Follow up in one year.

## 2016-04-25 ENCOUNTER — Encounter: Payer: Self-pay | Admitting: Cardiology

## 2016-04-25 ENCOUNTER — Ambulatory Visit (INDEPENDENT_AMBULATORY_CARE_PROVIDER_SITE_OTHER): Payer: BLUE CROSS/BLUE SHIELD | Admitting: Cardiology

## 2016-04-25 VITALS — BP 126/86 | HR 85 | Ht 64.0 in | Wt 184.0 lb

## 2016-04-25 DIAGNOSIS — Z9889 Other specified postprocedural states: Secondary | ICD-10-CM

## 2016-04-25 DIAGNOSIS — Z8679 Personal history of other diseases of the circulatory system: Secondary | ICD-10-CM

## 2016-04-25 DIAGNOSIS — I10 Essential (primary) hypertension: Secondary | ICD-10-CM

## 2016-04-25 DIAGNOSIS — Z720 Tobacco use: Secondary | ICD-10-CM

## 2016-04-25 DIAGNOSIS — E7800 Pure hypercholesterolemia, unspecified: Secondary | ICD-10-CM | POA: Diagnosis not present

## 2016-04-25 MED ORDER — OMEPRAZOLE 20 MG PO CPDR
20.0000 mg | DELAYED_RELEASE_CAPSULE | Freq: Every day | ORAL | 3 refills | Status: DC
Start: 2016-04-25 — End: 2017-06-05

## 2016-04-25 MED ORDER — ROSUVASTATIN CALCIUM 20 MG PO TABS: 20.0000 mg | ORAL_TABLET | Freq: Every day | ORAL | 3 refills | Status: DC

## 2016-04-25 NOTE — Patient Instructions (Addendum)
You may reduce ASA to 81 mg daily  Continue your other therapy  Stop smoking.  I will see you in one year

## 2017-04-27 ENCOUNTER — Encounter: Payer: Self-pay | Admitting: *Deleted

## 2017-05-01 ENCOUNTER — Ambulatory Visit: Payer: BLUE CROSS/BLUE SHIELD | Admitting: Cardiology

## 2017-05-06 ENCOUNTER — Other Ambulatory Visit: Payer: Self-pay | Admitting: Cardiology

## 2017-05-06 DIAGNOSIS — Z9889 Other specified postprocedural states: Principal | ICD-10-CM

## 2017-05-06 DIAGNOSIS — Z8679 Personal history of other diseases of the circulatory system: Secondary | ICD-10-CM

## 2017-05-06 NOTE — Progress Notes (Deleted)
April [name] Date of Birth: [birth year] Medical Record #[id number]  History of Present Illness: April [name] is seen today for followup thoracic aneurysm. She status post thoracic aneurysm repair in 2007 by Dr. Laneta SimmersBartle. No CAD at that time.   On follow up today she reports she is doing well.  She has chronic right parasternal discomfort at times. She is still smoking 1/2 pk/day. No other complaints.   Allergies as of 05/12/2017   No Known Allergies     Medication List       Accurate as of 05/06/17  2:47 PM. Always use your most recent med list.          aspirin 325 MG tablet Take 325 mg by mouth daily.   Fish Oil 1000 MG Caps Take 2 capsules by mouth.   omeprazole 20 MG capsule Commonly known as:  PRILOSEC Take 1 capsule (20 mg total) by mouth daily.   rosuvastatin 20 MG tablet Commonly known as:  CRESTOR Take 1 tablet (20 mg total) by mouth daily.       No Known Allergies  Past Medical History:  Diagnosis Date  . Hypercholesterolemia   . Hypertension   . Thoracic aortic aneurysm (HCC)    Ascending. Status post repair in April 2007  . Tobacco dependence     Past Surgical History:  Procedure Laterality Date  . DESCENDING AORTIC ANEURYSM REPAIR  2007   Status post ascending with a #3728mm Hemashield tube graft in April 2007.  . TUBAL LIGATION      History  Smoking Status  . Current Every Day Smoker  . Packs/day: 1.00  Smokeless Tobacco  . Not on file    History  Alcohol Use No    Family History  Problem Relation Age of Onset  . Hypertension Mother   . Diabetes Brother     Review of Systems: As noted in history of present illness.  All other systems were reviewed and are negative.  Physical Exam: There were no vitals taken for this visit. The patient is alert and oriented x 3.   The HEENT exam is unremarkable.  The carotids are 2+ without bruits.  There is no thyromegaly.  There is no JVD.  The lungs are clear.  The chest wall is non tender.   The heart exam reveals a regular rate with a normal S1 and S2.  There are no murmurs, gallops, or rubs.  The PMI is not displaced.   Abdominal exam reveals good bowel sounds. Exam of the legs reveal no clubbing, cyanosis, or edema.   The distal pulses are intact.  Cranial nerves II - XII are intact.   The gait is normal.  LABORATORY DATA: Lab Results  Component Value Date   WBC 8.4 04/23/2015   HGB 14.6 04/23/2015   HCT 43.0 04/23/2015   PLT 262 04/23/2015   GLUCOSE 90 04/23/2015   CHOL 158 04/23/2015   TRIG 72 04/23/2015   HDL 47 04/23/2015   LDLCALC 97 04/23/2015   ALT 17 04/23/2015   AST 20 04/23/2015   NA 143 04/23/2015   K 4.8 04/23/2015   CL 106 04/23/2015   CREATININE 0.61 04/23/2015   BUN 16 04/23/2015   CO2 29 04/23/2015   Labs dated 04/21/16: Normal chemistries and CBC. Cholesterol 172, triglycerides 95, HDL 53, LDL 100. Dated 05/04/17: cholesterol 167, triglycerides 96, HDL 51, LDL 97. CMET and CBC normal.   Ecg today shows NSR with rate 64 Nonspecific ST abnormality. I have personally  reviewed and interpreted this study.  CT chest 10/14/`/16:CT ANGIOGRAPHY CHEST WITH CONTRAST  TECHNIQUE: Multidetector CT imaging of the chest was performed using the standard protocol during bolus administration of intravenous contrast. Multiplanar CT image reconstructions and MIPs were obtained to evaluate the vascular anatomy.  CONTRAST:  OMNIPAQUE IOHEXOL 350 MG/ML SOLN  COMPARISON:  Two-view chest x-ray 12/27/2012. CT of the chest with contrast 08/15/2006.  FINDINGS: The heart size is normal. The patient is status post knee tension anatomy. Coronary artery calcifications are present. There is no significant pleural or pericardial effusion.  Atherosclerotic calcifications are present at the origin of the left subclavian artery. The aortic arch is otherwise unremarkable. There is no significant thoracic aorta aneurysm. Pulmonary arteries are intact. There is no  significant emboli.  No significant mediastinal or axillary adenopathy is present.  Mild diffuse ground-glass attenuation is present throughout both lungs. This was not present on the prior study.  Bone windows are unremarkable. No focal lytic or blastic lesions are present. Vertebral body heights are maintained.  Review of the MIP images confirms the above findings.  IMPRESSION: 1. No evidence for thoracic aortic aneurysm or dissection. 2. The pulmonary arteries are within normal limits. 3. Scattered ground-glass attenuation throughout both lungs. This may represent scattered atelectasis or mild edema.   Electronically Signed   By: Marin Roberts M.D.   On: 05/01/2015 14:45  Assessment / Plan: 1. Thoracic aortic aneurysm status post repair in 2007. Patient is asymptomatic. CT in October 2016 showed good result. Continue risk factor modification.  2. Hypercholesterolemia. Goal LDL is less than 100. Continue Crestor 20 mg daily.   3. Tobacco abuse. Encouraged complete smoking cessation.  4. Obesity.   Follow up in one year.

## 2017-05-08 ENCOUNTER — Other Ambulatory Visit: Payer: Self-pay | Admitting: *Deleted

## 2017-05-08 DIAGNOSIS — Z9889 Other specified postprocedural states: Principal | ICD-10-CM

## 2017-05-08 DIAGNOSIS — Z8679 Personal history of other diseases of the circulatory system: Secondary | ICD-10-CM

## 2017-05-08 MED ORDER — ROSUVASTATIN CALCIUM 20 MG PO TABS
20.0000 mg | ORAL_TABLET | Freq: Every day | ORAL | 0 refills | Status: DC
Start: 2017-05-08 — End: 2017-09-06

## 2017-05-12 ENCOUNTER — Ambulatory Visit: Payer: BLUE CROSS/BLUE SHIELD | Admitting: Cardiology

## 2017-06-05 ENCOUNTER — Other Ambulatory Visit: Payer: Self-pay | Admitting: Cardiology

## 2017-06-05 DIAGNOSIS — Z9889 Other specified postprocedural states: Principal | ICD-10-CM

## 2017-06-05 DIAGNOSIS — Z8679 Personal history of other diseases of the circulatory system: Secondary | ICD-10-CM

## 2017-08-01 NOTE — Progress Notes (Signed)
April [name] Date of Birth: 15-Jan-1966 Medical Record #[id number]  History of Present Illness: April [name] is seen today for followup thoracic aneurysm. She status post thoracic aneurysm repair in 2007 by Dr. Laneta SimmersBartle. No CAD at that time.  On follow up today she reports she is doing well. She is under a lot of stress now since her husband was just diagnosed with pancreatic cancer. Starting chemotherapy.  She denies any significant chest pain or SOB. She is still smoking.No other complaints. She has lost 12 lbs since her last visit here.  Allergies as of 08/03/2017   No Known Allergies     Medication List        Accurate as of 08/03/17 11:19 AM. Always use your most recent med list.          aspirin EC 81 MG tablet Take 81 mg by mouth daily.   Fish Oil 1000 MG Caps Take 2 capsules by mouth.   omeprazole 20 MG capsule Commonly known as:  PRILOSEC TAKE ONE CAPSULE BY MOUTH ONCE DAILY   rosuvastatin 20 MG tablet Commonly known as:  CRESTOR Take 1 tablet (20 mg total) by mouth daily. KEEP OV.       No Known Allergies  Past Medical History:  Diagnosis Date  . Hypercholesterolemia   . Hypertension   . Thoracic aortic aneurysm (HCC)    Ascending. Status post repair in April 2007  . Tobacco dependence     Past Surgical History:  Procedure Laterality Date  . DESCENDING AORTIC ANEURYSM REPAIR  2007   Status post ascending with a #6228mm Hemashield tube graft in April 2007.  . TUBAL LIGATION      Social History   Tobacco Use  Smoking Status Current Every Day Smoker  . Packs/day: 1.00  Smokeless Tobacco Never Used    Social History   Substance and Sexual Activity  Alcohol Use No  . Alcohol/week: 0.0 oz    Family History  Problem Relation Age of Onset  . Hypertension Mother   . Diabetes Brother     Review of Systems: As noted in history of present illness.  All other systems were reviewed and are negative.  Physical Exam: BP 134/76 (BP Location: Right  Arm, Patient Position: Sitting, Cuff Size: Normal)   Pulse 67   Ht 5\' 4"  (1.626 m)   Wt 172 lb (78 kg)   BMI 29.52 kg/m  GENERAL:  Well appearing WF in NAD HEENT:  PERRL, EOMI, sclera are clear. Oropharynx is clear. NECK:  No jugular venous distention, carotid upstroke brisk and symmetric, no bruits, no thyromegaly or adenopathy LUNGS:  Clear to auscultation bilaterally CHEST:  Unremarkable HEART:  RRR,  PMI not displaced or sustained,S1 and S2 within normal limits, no S3, no S4: no clicks, no rubs, no murmurs ABD:  Soft, nontender. BS +, no masses or bruits. No hepatomegaly, no splenomegaly EXT:  2 + pulses throughout, no edema, no cyanosis no clubbing SKIN:  Warm and dry.  No rashes NEURO:  Alert and oriented x 3. Cranial nerves II through XII intact. PSYCH:  Cognitively intact    LABORATORY DATA: Lab Results  Component Value Date   WBC 8.4 04/23/2015   HGB 14.6 04/23/2015   HCT 43.0 04/23/2015   PLT 262 04/23/2015   GLUCOSE 90 04/23/2015   CHOL 158 04/23/2015   TRIG 72 04/23/2015   HDL 47 04/23/2015   LDLCALC 97 04/23/2015   ALT 17 04/23/2015   AST 20 04/23/2015  NA 143 04/23/2015   K 4.8 04/23/2015   CL 106 04/23/2015   CREATININE 0.61 04/23/2015   BUN 16 04/23/2015   CO2 29 04/23/2015   Labs dated 04/21/16: Normal chemistries and CBC. Cholesterol 172, triglycerides 95, HDL 53, LDL 100. Dated 05/04/17: Normal CBC and CMET. Cholesterol 167, triglycerides 96, HDL 51, LDL 97. Dated 07/27/17: cholesterol 164, triglycerides 74, HDL 53, LDL 96. Glucose 105. Otherwise CBC and CMET normal.   Ecg today shows NSR with rate 63. Normal Ecg.   I have personally reviewed and interpreted this study.   Assessment / Plan: 1. Thoracic aortic aneurysm status post repair in 2007. Patient is asymptomatic. CT in October 2016 showed good result. Continue risk factor modification.  2. Hypercholesterolemia. Goal LDL is less than 100. Continue Crestor 20 mg daily.   3. Tobacco abuse.  Encouraged complete smoking cessation.  4. Obesity.   Follow up in one year.

## 2017-08-03 ENCOUNTER — Ambulatory Visit: Payer: BLUE CROSS/BLUE SHIELD | Admitting: Cardiology

## 2017-08-03 ENCOUNTER — Encounter: Payer: Self-pay | Admitting: Cardiology

## 2017-08-03 ENCOUNTER — Encounter (INDEPENDENT_AMBULATORY_CARE_PROVIDER_SITE_OTHER): Payer: Self-pay

## 2017-08-03 VITALS — BP 134/76 | HR 67 | Ht 64.0 in | Wt 172.0 lb

## 2017-08-03 DIAGNOSIS — Z9889 Other specified postprocedural states: Secondary | ICD-10-CM

## 2017-08-03 DIAGNOSIS — E7800 Pure hypercholesterolemia, unspecified: Secondary | ICD-10-CM

## 2017-08-03 DIAGNOSIS — Z720 Tobacco use: Secondary | ICD-10-CM

## 2017-08-03 DIAGNOSIS — I10 Essential (primary) hypertension: Secondary | ICD-10-CM | POA: Diagnosis not present

## 2017-08-03 DIAGNOSIS — Z8679 Personal history of other diseases of the circulatory system: Secondary | ICD-10-CM

## 2017-08-03 NOTE — Patient Instructions (Signed)
You need to quit smoking  Continue your current medication  I will see you in one year.

## 2017-09-06 ENCOUNTER — Other Ambulatory Visit: Payer: Self-pay | Admitting: Cardiology

## 2017-09-06 DIAGNOSIS — Z9889 Other specified postprocedural states: Principal | ICD-10-CM

## 2017-09-06 DIAGNOSIS — Z8679 Personal history of other diseases of the circulatory system: Secondary | ICD-10-CM

## 2017-09-06 NOTE — Telephone Encounter (Signed)
REFILL 

## 2018-02-04 ENCOUNTER — Emergency Department (HOSPITAL_COMMUNITY): Payer: BLUE CROSS/BLUE SHIELD

## 2018-02-04 ENCOUNTER — Encounter (HOSPITAL_COMMUNITY): Payer: Self-pay | Admitting: Emergency Medicine

## 2018-02-04 ENCOUNTER — Other Ambulatory Visit: Payer: Self-pay

## 2018-02-04 ENCOUNTER — Emergency Department (HOSPITAL_COMMUNITY)
Admission: EM | Admit: 2018-02-04 | Discharge: 2018-02-04 | Disposition: A | Discharge status: Home / Self Care | Payer: BLUE CROSS/BLUE SHIELD | Attending: Emergency Medicine | Admitting: Emergency Medicine

## 2018-02-04 DIAGNOSIS — I10 Essential (primary) hypertension: Secondary | ICD-10-CM | POA: Diagnosis not present

## 2018-02-04 DIAGNOSIS — I712 Thoracic aortic aneurysm, without rupture: Secondary | ICD-10-CM | POA: Diagnosis not present

## 2018-02-04 DIAGNOSIS — J45909 Unspecified asthma, uncomplicated: Secondary | ICD-10-CM | POA: Insufficient documentation

## 2018-02-04 DIAGNOSIS — M79605 Pain in left leg: Secondary | ICD-10-CM | POA: Insufficient documentation

## 2018-02-04 DIAGNOSIS — F17210 Nicotine dependence, cigarettes, uncomplicated: Secondary | ICD-10-CM | POA: Diagnosis not present

## 2018-02-04 DIAGNOSIS — Z7982 Long term (current) use of aspirin: Secondary | ICD-10-CM | POA: Insufficient documentation

## 2018-02-04 LAB — D-DIMER, QUANTITATIVE: D-Dimer, Quant: 0.49 ug/mL-FEU (ref 0.00–0.50)

## 2018-02-04 LAB — CK: Total CK: 78 U/L (ref 38–234)

## 2018-02-04 MED ORDER — TRAMADOL HCL 50 MG PO TABS: 50.0000 mg | ORAL_TABLET | Freq: Four times a day (QID) | ORAL | 0 refills | Status: DC | PRN

## 2018-02-04 MED ORDER — HYDROCODONE-ACETAMINOPHEN 5-325 MG PO TABS: 1.0000 | ORAL_TABLET | ORAL | 0 refills | Status: DC | PRN

## 2018-02-04 MED ORDER — TRAMADOL HCL 50 MG PO TABS
50.0000 mg | ORAL_TABLET | Freq: Once | ORAL | Status: AC
  Administered 2018-02-04: 50 mg via ORAL
  Filled 2018-02-04: qty 1

## 2018-02-04 MED ORDER — HYDROCODONE-ACETAMINOPHEN 5-325 MG PO TABS
1.0000 | ORAL_TABLET | Freq: Once | ORAL | Status: AC
  Administered 2018-02-04: 1 via ORAL
  Filled 2018-02-04: qty 1

## 2018-02-04 NOTE — ED Provider Notes (Signed)
[facility] EMERGENCY DEPARTMENT Provider Note   CSN: [id number] Arrival date & time: 02/04/18  1811     History   Chief Complaint Chief Complaint  Patient presents with  . Leg Pain    HPI April [name] is a 52 y.o. female presenting with a one-week history of burning and  aching pain in her left leg.  She states it originally started in her left lateral hip area and has since become more tender in her mid anterior thigh through her knee and upper tibia.  She describes pain that is constant but worse with weightbearing and ambulation.  She was seen by her PCP on Wednesday who per patient's report felt was possibly related to her cholesterol medicine.  She was switched from Crestor to a statin and was also placed on meloxicam but she has had no improvement in symptoms.  She denies swelling in the leg or ankle, also denies history of injury or overuse.  She gets some pain relief with rest, but it is not completely resolved.  She also denies weakness or numbness in the extremity, no shortness of breath, chest pain.  The history is provided by the patient.    Past Medical History:  Diagnosis Date  . Hypercholesterolemia   . Hypertension   . Thoracic aortic aneurysm (HCC)    Ascending. Status post repair in April 2007  . Tobacco dependence     Patient Active Problem List   Diagnosis Date Noted  . HTN (hypertension) 06/17/2011  . History of thoracic aortic aneurysm repair 06/17/2011  . Hypercholesteremia 06/17/2011  . Tobacco abuse 06/17/2011  . Essential (primary) hypertension 06/17/2011  . History of cardiovascular surgery 06/17/2011  . Hypercholesterolemia without hypertriglyceridemia 06/17/2011  . 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 DESCENDING AORTIC ANEURYSM REPAIR  2007   Status post ascending with a #37mm Hemashield tube graft in April 2007.  . TUBAL LIGATION       OB History   None      Home  Medications    Prior to Admission medications   Medication Sig Start Date End Date Taking? Authorizing Provider  aspirin EC 81 MG tablet Take 81 mg by mouth daily.    [provider]  Omega-3 Fatty Acids (FISH OIL) 1000 MG CAPS Take 2 capsules by mouth.    [provider]  omeprazole (PRILOSEC) 20 MG capsule TAKE ONE CAPSULE BY MOUTH ONCE DAILY 06/05/17   [place], Peter M, MD  rosuvastatin (CRESTOR) 20 MG tablet TAKE 1 TABLET BY MOUTH ONCE DAILY. KEEP OFFICE VISIT 09/06/17   [place], Peter M, MD  traMADol (ULTRAM) 50 MG tablet Take 1 tablet (50 mg total) by mouth every 6 (six) hours as needed. 02/04/18   Burgess Amor, PA-C    Family History Family History  Problem Relation Age of Onset  . Hypertension Mother   . Diabetes Brother     Social History Social History   Tobacco Use  . Smoking status: Current Every Day Smoker    Packs/day: 1.00    Types: Cigarettes  . Smokeless tobacco: Never Used  Substance Use Topics  . Alcohol use: No    Alcohol/week: 0.0 oz  . Drug use: Never     Allergies   Patient has no known allergies.   Review of Systems Review of Systems  Constitutional: Negative for fever.  HENT: Negative.   Respiratory: Negative.   Cardiovascular: Negative.   Gastrointestinal: Negative.  Musculoskeletal: Positive for arthralgias. Negative for joint swelling and myalgias.  Skin: Negative.   Neurological: Negative for weakness and numbness.     Physical Exam Updated Vital Signs BP (!) 177/83   Pulse 81   Temp 99.1 F (37.3 C) (Oral)   Resp 20   Ht 5\' 4"  (1.626 m)   Wt 78.5 kg (173 lb)   SpO2 98%   BMI 29.70 kg/m   Physical Exam  Constitutional: She appears well-developed and well-nourished.  HENT:  Head: Atraumatic.  Neck: Normal range of motion.  Cardiovascular:  Pulses equal bilaterally  Musculoskeletal: She exhibits tenderness. She exhibits no edema or deformity.       Left upper leg: She exhibits no swelling, no edema and no  deformity.       Legs: Patient is tender to palpation along her mid to lower anterior left thigh.  There is no edema, no induration, rash.  She has no tenderness palpation on her posterior thigh or calf.  She describes pain that radiates to her upper anterior tibia, but this is not reproducible.  She has full range of motion of her knee, no crepitus and I am not able to reproduce pain with knee joint range of motion.  She has no ankle edema.  Dorsalis pedis pulses are equal and full bilaterally.  Neurological: She is alert. She has normal strength. She displays normal reflexes. No sensory deficit.  Skin: Skin is warm and dry. No bruising and no rash noted. No erythema.  Psychiatric: She has a normal mood and affect.     ED Treatments / Results  Labs (all labs ordered are listed, but only abnormal results are displayed) Labs Reviewed  CK  D-DIMER, QUANTITATIVE ([facility])    EKG None  Radiology Dg Knee Complete 4 Views Left  Result Date: 02/04/2018 CLINICAL DATA:  Pain for approximately 1 week EXAM: LEFT KNEE - COMPLETE 4+ VIEW COMPARISON:  None. FINDINGS: Frontal, lateral, and bilateral oblique views were obtained. No fracture or dislocation. No joint effusion. Joint spaces appear normal. No erosive change. IMPRESSION: No fracture or joint effusion.  No appreciable arthropathy. Electronically Signed   By: Bretta BangWilliam  Woodruff III M.D.   On: 02/04/2018 20:34    Procedures Procedures (including critical care time)  Medications Ordered in ED Medications  traMADol (ULTRAM) tablet 50 mg (50 mg Oral Given 02/04/18 2003)     Initial Impression / Assessment and Plan / ED Course  I have reviewed the triage vital signs and the nursing notes.  Pertinent labs & imaging results that were available during my care of the patient were reviewed by me and considered in my medical decision making (see chart for details).     Patient with left leg pain which started in her left lateral thigh now  is more concentrated in her anterior left thigh with radiation into her upper tibia.  There are no findings on her exam to suggest the source of this pain.  No edema, bruising, erythema.  She has no palpable cords, no varicosities although there are some small areas of superficial spider veins present.  She does describe a burning sensation, and with lack of current physical exam findings, it is possible this is an early shingles which is just not developed into a rash at this point in time.  Her labs were reviewed and discussed with her, normal d-dimer, and with normal exam doubt that this is a DVT.  She has a normal CK reducing the likelihood  that this is rhabdomyolysis from her Crestor.  We discussed that she should get rechecked by her PCP if symptoms persist, she was given a small quantity of hydrocodone for pain relief as the tramadol given while here gave only minimal improvement.  Discussed that if there is a rash develops she needs to get started on an antiviral as this would confirm shingles.  Patient understands and agrees with plan.  Final Clinical Impressions(s) / ED Diagnoses   Final diagnoses:  Left leg pain    ED Discharge Orders        Ordered    traMADol (ULTRAM) 50 MG tablet  Every 6 hours PRN     02/04/18 2135       Burgess Amor, PA-C 02/05/18 0004    Long, Arlyss Repress, MD 02/05/18 1017

## 2018-02-04 NOTE — Discharge Instructions (Addendum)
Your x-ray and your lab tests are negative tonight.  Your blood work has essentially eliminated the possibility that this is a DVT (blood clot).  Your exam also does not suggest this possibility.  You have been prescribed a mild pain reliever, although this may make you drowsy, do not drive within 4 hours of taking this medication.  You may also benefit by applying heat for 15 to 20 minutes several times daily to your side of maximum discomfort.  Plan to follow-up with your doctor for recheck if your symptoms persist or worsen.  As discussed,  if you develop any rash (red blisters) along the length of your pain, this would confirm your pain is from a shingles outbreak.  Call your doctor for a recheck of your symptoms if not improving and especially if you develop such a rash.

## 2018-02-04 NOTE — ED Triage Notes (Addendum)
Patient c/o left leg pain that started x1 week ago. Per patient originally started in hip. Patient seen by PCP on Wednesday and given meloxicam in which is not giving relief. Patient now states pain is from upper leg to calf. Patient states worse with weight bearing.

## 2018-02-12 ENCOUNTER — Other Ambulatory Visit: Payer: Self-pay | Admitting: Family Medicine

## 2018-02-12 DIAGNOSIS — M79605 Pain in left leg: Secondary | ICD-10-CM

## 2018-05-08 ENCOUNTER — Other Ambulatory Visit: Payer: Self-pay | Admitting: Cardiology

## 2018-05-08 DIAGNOSIS — Z9889 Other specified postprocedural states: Principal | ICD-10-CM

## 2018-05-08 DIAGNOSIS — Z8679 Personal history of other diseases of the circulatory system: Secondary | ICD-10-CM

## 2018-06-16 ENCOUNTER — Encounter (HOSPITAL_COMMUNITY): Payer: Self-pay | Admitting: Emergency Medicine

## 2018-06-16 ENCOUNTER — Other Ambulatory Visit: Payer: Self-pay

## 2018-06-16 ENCOUNTER — Emergency Department (HOSPITAL_COMMUNITY)
Admission: EM | Admit: 2018-06-16 | Discharge: 2018-06-16 | Disposition: A | Discharge status: Home / Self Care | Payer: BLUE CROSS/BLUE SHIELD | Attending: Emergency Medicine | Admitting: Emergency Medicine

## 2018-06-16 DIAGNOSIS — Z79899 Other long term (current) drug therapy: Secondary | ICD-10-CM | POA: Insufficient documentation

## 2018-06-16 DIAGNOSIS — Z7982 Long term (current) use of aspirin: Secondary | ICD-10-CM | POA: Diagnosis not present

## 2018-06-16 DIAGNOSIS — F17210 Nicotine dependence, cigarettes, uncomplicated: Secondary | ICD-10-CM | POA: Diagnosis not present

## 2018-06-16 DIAGNOSIS — B349 Viral infection, unspecified: Secondary | ICD-10-CM

## 2018-06-16 DIAGNOSIS — M791 Myalgia, unspecified site: Secondary | ICD-10-CM | POA: Diagnosis present

## 2018-06-16 LAB — GROUP A STREP BY PCR: Group A Strep by PCR: NOT DETECTED

## 2018-06-16 NOTE — ED Triage Notes (Signed)
Pt c/o body aches and sore throat that started Friday.

## 2018-06-16 NOTE — Discharge Instructions (Addendum)
Drink plenty of fluids.  Take ibuprofen 600 mg plus acetaminophen 1000 mg every 6 hours as needed for body aches or low-grade fever.  Recheck if you start having a bad cough, struggle to breathe, or you seem worse.

## 2018-06-16 NOTE — ED Provider Notes (Signed)
[facility] EMERGENCY DEPARTMENT Provider Note   CSN: [id number] Arrival date & time: 06/16/18  0340  Time seen 06:25 AM   History   Chief Complaint Chief Complaint  Patient presents with  . Generalized Body Aches    HPI April [name] is a 52 y.o. female.  HPI patient states yesterday morning she woke up with myalgias that got worse around lunchtime.  She states her highest temperature was 100.2.  She states she has chills and a lot of muscle aches in her lower legs especially her thighs.  She denies cough, she states she has mild rhinorrhea, she denies sneezing, nausea, vomiting, diarrhea, chest pain, shortness of breath, dysuria, frequency.  She states her legs are feeling better now.  She states she has a mild sore throat.  Nobody else that she is around has been ill.  She states the main thing is the body aches in her lower legs.  She does not have body aches in her upper arms.  She is moving her head freely without neck pain.  PCP Joette Catching, MD   Past Medical History:  Diagnosis Date  . Hypercholesterolemia   . Hypertension   . Thoracic aortic aneurysm (HCC)    Ascending. Status post repair in April 2007  . Tobacco dependence     Patient Active Problem List   Diagnosis Date Noted  . HTN (hypertension) 06/17/2011  . History of thoracic aortic aneurysm repair 06/17/2011  . Hypercholesteremia 06/17/2011  . Tobacco abuse 06/17/2011  . Essential (primary) hypertension 06/17/2011  . History of cardiovascular surgery 06/17/2011  . Hypercholesterolemia without hypertriglyceridemia 06/17/2011  . 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 DESCENDING AORTIC ANEURYSM REPAIR  2007   Status post ascending with a #87mm Hemashield tube graft in April 2007.  . TUBAL LIGATION       OB History   None      Home Medications    Prior to Admission medications   Medication Sig Start Date End Date Taking?  Authorizing Provider  aspirin EC 81 MG tablet Take 81 mg by mouth daily.    [provider]  HYDROcodone-acetaminophen (NORCO/VICODIN) 5-325 MG tablet Take 1 tablet by mouth every 4 (four) hours as needed. 02/04/18   Burgess Amor, PA-C  Omega-3 Fatty Acids (FISH OIL) 1000 MG CAPS Take 2 capsules by mouth.    [provider]  omeprazole (PRILOSEC) 20 MG capsule TAKE 1 CAPSULE BY MOUTH ONCE DAILY (NEEDS APPOIINTMENT FOR REFILLS) 05/08/18   [place], Peter M, MD  rosuvastatin (CRESTOR) 20 MG tablet TAKE 1 TABLET BY MOUTH ONCE DAILY. KEEP OFFICE VISIT 09/06/17   [place], Peter M, MD    Family History Family History  Problem Relation Age of Onset  . Hypertension Mother   . Diabetes Brother     Social History Social History   Tobacco Use  . Smoking status: Current Every Day Smoker    Packs/day: 1.00    Types: Cigarettes  . Smokeless tobacco: Never Used  Substance Use Topics  . Alcohol use: No    Alcohol/week: 0.0 standard drinks  . 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   Review of Systems Review of Systems  All other systems reviewed and are negative.    Physical Exam Updated Vital Signs BP 110/88   Pulse 85   Temp 99.5 F (37.5 C) (Oral)  Resp 17   Ht 5\' 4"  (1.626 m)   Wt 82.6 kg   SpO2 94%   BMI 31.24 kg/m   Physical Exam  Constitutional: She is oriented to person, place, and time. She appears well-developed and well-nourished.  Non-toxic appearance. She does not appear ill. No distress.  Minimal redness of her posterior pharynx.  The uvula is midline, there is no soft palate swelling, her voice is normal.  Tonsils are not enlarged and there is no exudates seen.  HENT:  Head: Normocephalic and atraumatic.  Right Ear: External ear normal.  Left Ear: External ear normal.  Nose: Nose normal. No mucosal edema or rhinorrhea.  Mouth/Throat: Oropharynx is clear and moist and mucous membranes  are normal. No dental abscesses or uvula swelling.  Eyes: Pupils are equal, round, and reactive to light. Conjunctivae and EOM are normal.  Neck: Normal range of motion and full passive range of motion without pain. Neck supple.  Cardiovascular: Normal rate, regular rhythm and normal heart sounds. Exam reveals no gallop and no friction rub.  No murmur heard. Pulmonary/Chest: Effort normal and breath sounds normal. No respiratory distress. She has no wheezes. She has no rhonchi. She has no rales. She exhibits no tenderness and no crepitus.  Abdominal: Normal appearance.  Musculoskeletal: Normal range of motion. She exhibits no edema or tenderness.  Moves all extremities well.  Patient indicates she did have the body aches in her thigh muscles bilaterally.  Neurological: She is alert and oriented to person, place, and time. She has normal strength. No cranial nerve deficit.  Skin: Skin is warm, dry and intact. No rash noted. No erythema. No pallor.  Psychiatric: She has a normal mood and affect. Her speech is normal and behavior is normal. Her mood appears not anxious.  Nursing note and vitals reviewed.    ED Treatments / Results  Labs (all labs ordered are listed, but only abnormal results are displayed)  Results for orders placed or performed during the hospital encounter of 06/16/18  Group A Strep by PCR  Result Value Ref Range   Group A Strep by PCR NOT DETECTED NOT DETECTED     EKG None  Radiology No results found.  Procedures Procedures (including critical care time)  Medications Ordered in ED Medications - No data to display   Initial Impression / Assessment and Plan / ED Course  I have reviewed the triage vital signs and the nursing notes.  Pertinent labs & imaging results that were available during my care of the patient were reviewed by me and considered in my medical decision making (see chart for details).    Patient's repeat temp was 99.5.  We discussed her  rapid strep was negative.  Her symptoms sound like some type of viral illness although I do not suspect it is the flu.  She only has the fever body aches and a minimal sore throat.  I also do not feel like she has any underlying medical disease that would warrant her being placed on Tamiflu due to the side effects.  She was advised to drink plenty fluids take Motrin and Tylenol for fever and body aches she should be rechecked if her any of her symptoms seems worse.   Final Clinical Impressions(s) / ED Diagnoses   Final diagnoses:  Viral illness    ED Discharge Orders    None    OTC ibuprofen and acetaminophen  Plan discharge  Devoria AlbeIva Sigourney Portillo, MD, Concha PyoFACEP    Wai Minotti, MD 06/16/18  0652  

## 2018-08-02 NOTE — Progress Notes (Signed)
April [name] Date of Birth: 1966/02/17 Medical Record #[id number]  History of Present Illness: April [name] is seen today for followup thoracic aneurysm. She status post thoracic aneurysm repair in 2007 by Dr. Laneta Simmers. No CAD at that time. :Last follow up CT in October 2016. She has a history of HTN, HLD, and tobacco abuse.   On follow up today she reports she is doing well. 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 She has experienced some discomfort in the back of her neck and chest related to pulling. She still smokes a little. Worse when she is off work. In general she feels well. She did develop severe leg weakness to the point she couldn't function. She stopped her statin and symptoms resolved. They did resume when she went back on statin. Now on Zetia.   Allergies as of 08/15/2018   No Known Allergies     Medication List       Accurate as of August 15, 2018  9:44 AM. Always use your most recent med list.        aspirin EC 81 MG tablet Take 81 mg by mouth daily.   ezetimibe 10 MG tablet Commonly known as:  ZETIA Take 10 mg by mouth daily.   Fish Oil 1000 MG Caps Take 2 capsules by mouth.   HYDROcodone-acetaminophen 5-325 MG tablet Commonly known as:  NORCO/VICODIN Take 1 tablet by mouth every 4 (four) hours as needed.   omeprazole 20 MG capsule Commonly known as:  PRILOSEC TAKE 1 CAPSULE BY MOUTH ONCE DAILY (NEEDS APPOIINTMENT FOR REFILLS)       No Known Allergies  Past Medical History:  Diagnosis Date  . Hypercholesterolemia   . Hypertension   . Thoracic aortic aneurysm (HCC)    Ascending. Status post repair in April 2007  . Tobacco dependence     Past Surgical History:  Procedure Laterality Date  . DESCENDING AORTIC ANEURYSM REPAIR  2007   Status post ascending with a #57mm Hemashield tube graft in April 2007.  . TUBAL LIGATION      Social History   Tobacco Use  Smoking Status Current Every Day  Smoker  . Packs/day: 1.00  . Types: Cigarettes  Smokeless Tobacco Never Used    Social History   Substance and Sexual Activity  Alcohol Use No  . Alcohol/week: 0.0 standard drinks    Family History  Problem Relation Age of Onset  . Hypertension Mother   . Diabetes Brother     Review of Systems: As noted in history of present illness.  All other systems were reviewed and are negative.  Physical Exam: BP 134/74 (BP Location: Right Arm, Patient Position: Sitting, Cuff Size: Normal)   Pulse 83   Ht 5\' 4"  (1.626 m)   Wt 183 lb (83 kg)   BMI 31.41 kg/m  GENERAL:  Well appearing HEENT:  PERRL, EOMI, sclera are clear. Oropharynx is clear. NECK:  No jugular venous distention, carotid upstroke brisk and symmetric, no bruits, no thyromegaly or adenopathy LUNGS:  Clear to auscultation bilaterally CHEST:  Unremarkable HEART:  RRR,  PMI not displaced or sustained,S1 and S2 within normal limits, no S3, no S4: no clicks, no rubs, no murmurs ABD:  Soft, nontender. BS +, no masses or bruits. No hepatomegaly, no splenomegaly EXT:  2 + pulses throughout, no edema, no cyanosis no clubbing SKIN:  Warm and dry.  No rashes NEURO:  Alert and oriented x  3. Cranial nerves II through XII intact. PSYCH:  Cognitively intact      LABORATORY DATA: Lab Results  Component Value Date   WBC 8.4 04/23/2015   HGB 14.6 04/23/2015   HCT 43.0 04/23/2015   PLT 262 04/23/2015   GLUCOSE 90 04/23/2015   CHOL 158 04/23/2015   TRIG 72 04/23/2015   HDL 47 04/23/2015   LDLCALC 97 04/23/2015   ALT 17 04/23/2015   AST 20 04/23/2015   NA 143 04/23/2015   K 4.8 04/23/2015   CL 106 04/23/2015   CREATININE 0.61 04/23/2015   BUN 16 04/23/2015   CO2 29 04/23/2015   Labs dated 04/21/16: Normal chemistries and CBC. Cholesterol 172, triglycerides 95, HDL 53, LDL 100. Dated 05/04/17: Normal CBC and CMET. Cholesterol 167, triglycerides 96, HDL 51, LDL 97. Dated 07/27/17: cholesterol 164, triglycerides 74, HDL  53, LDL 96. Glucose 105. Otherwise CBC and CMET normal.  Dated 04/04/18: cholesterol 257, triglycerides 112, HDL 49, LDL 186. CBC and CMET normal. Dated 07/20/18: cholesterol 241, triglycerides 88, HDL 52. LDL 177. CMET and CBC normal.   Ecg today shows NSR with rate 83. Nonspecific TWA.   I have personally reviewed and interpreted this study.   Assessment / Plan: 1. Thoracic aortic aneurysm status post repair in 2007. Patient is asymptomatic. CT in October 2016 showed good result. Continue risk factor modification.  2. Hypercholesterolemia. Goal LDL is less than 100. Developed side effects on Crestor. Now on Zetia but this is unlikely to get her to goal. Will have her see our Pharm D in lipid clinic. She may be a candidate for PCSK 9 inhibitor.   3. Tobacco abuse. Encouraged complete smoking cessation.  4. Obesity.   Follow up in one year.

## 2018-08-15 ENCOUNTER — Ambulatory Visit: Payer: BLUE CROSS/BLUE SHIELD | Admitting: Cardiology

## 2018-08-15 ENCOUNTER — Encounter: Payer: Self-pay | Admitting: Cardiology

## 2018-08-15 VITALS — BP 134/74 | HR 83 | Ht 64.0 in | Wt 183.0 lb

## 2018-08-15 DIAGNOSIS — E7800 Pure hypercholesterolemia, unspecified: Secondary | ICD-10-CM | POA: Diagnosis not present

## 2018-08-15 DIAGNOSIS — Z720 Tobacco use: Secondary | ICD-10-CM

## 2018-08-15 DIAGNOSIS — Z8679 Personal history of other diseases of the circulatory system: Secondary | ICD-10-CM | POA: Diagnosis not present

## 2018-08-15 DIAGNOSIS — Z9889 Other specified postprocedural states: Secondary | ICD-10-CM

## 2018-08-15 DIAGNOSIS — I10 Essential (primary) hypertension: Secondary | ICD-10-CM

## 2018-08-21 ENCOUNTER — Ambulatory Visit: Payer: BLUE CROSS/BLUE SHIELD

## 2018-08-22 ENCOUNTER — Ambulatory Visit: Payer: BLUE CROSS/BLUE SHIELD

## 2018-09-06 ENCOUNTER — Ambulatory Visit: Payer: BLUE CROSS/BLUE SHIELD

## 2018-09-11 ENCOUNTER — Ambulatory Visit (INDEPENDENT_AMBULATORY_CARE_PROVIDER_SITE_OTHER): Payer: BLUE CROSS/BLUE SHIELD | Admitting: Pharmacist

## 2018-09-11 VITALS — BP 132/82 | HR 67 | Resp 15 | Wt 183.2 lb

## 2018-09-11 DIAGNOSIS — E7800 Pure hypercholesterolemia, unspecified: Secondary | ICD-10-CM | POA: Diagnosis not present

## 2018-09-11 NOTE — Progress Notes (Signed)
Patient ID: April [name]                 DOB: [birth year]                    MRN: [id number]     HPI: April [name] is a 53 y.o. female patient referred to lipid clinic by Dr [place] . PMH is significant for thoracic aneurysm, hypertension, hyperlipidemia, and tobacco abuse. Patient reports failed trial with multiple statins and unable to take them due to severe muscle pain. Reports compliance with diet, and all other medication including Zetia.  Current Medications:  Ezetimibe 10mg  daily Omega-3 2000mg  daily  Intolerances:  Rosuvastatin 20mg  daily Pravastatin 40mg  daily pitavastatin 4mg  daily  LDL goal: < 70mg /dL  Diet: no fried foods, low fats, lots vegetables, low carbohydrates  Exercise: mainly at work, 15 minutes walks during days offs  Family History: MI in grandmother,  HDL in mother,   Social History: current smoker of 1/2 pack per day, no alcohol, daily coffee  Labs: 07/20/2018: CHO 241; HDL 52; TG 88; LDL-c 171 (ezetimibe plus omega-3)  Past Medical History:  Diagnosis Date  . Hypercholesterolemia   . Hypertension   . Thoracic aortic aneurysm (HCC)    Ascending. Status post repair in April 2007  . Tobacco dependence     Current Outpatient Medications on File Prior to Visit  Medication Sig Dispense Refill  . aspirin EC 81 MG tablet Take 81 mg by mouth daily.    Marland Kitchen ezetimibe (ZETIA) 10 MG tablet Take 10 mg by mouth daily.    Marland Kitchen HYDROcodone-acetaminophen (NORCO/VICODIN) 5-325 MG tablet Take 1 tablet by mouth every 4 (four) hours as needed. 15 tablet 0  . Omega-3 Fatty Acids (FISH OIL) 1000 MG CAPS Take 2 capsules by mouth.    Marland Kitchen omeprazole (PRILOSEC) 20 MG capsule TAKE 1 CAPSULE BY MOUTH ONCE DAILY (NEEDS APPOIINTMENT FOR REFILLS) 90 capsule 0   No current facility-administered medications on file prior to visit.     No Known Allergies  Hypercholesteremia LDL-c remains above desired goal for high risk patient. Noted history of severe muscle pain with  rosuvastatin, pravastatin, and pitavastatin. Patient reports compliance with low fat diet, ezetimibe, and omega-3.  PCSK9i is an appropriate next step in therapy. She still working on smoking cessation and reports smoking less cigarettes every day.   Repatha/Praleunt indication, MOA, PA- process, co-pay card programs, storage, administration, and common side effects were discussed during this OV. Will start paperwork for Repatha SureClick 140mg  approval and follow up with patient as needed.     April [name] PharmD, BCPS, CPP [facility] [address] [place] [address] 09/14/2018 11:41 AM

## 2018-09-11 NOTE — Patient Instructions (Signed)
[facility] (pharmacist) Kenzlee Fishburn/Kristin [phone]  *START paperwork for Repatha SureClick 140mg  every 14 days*   Cholesterol  Cholesterol is a fat. Your body needs a small amount of cholesterol. Cholesterol (plaque) may build up in your blood vessels (arteries). That makes you more likely to have a heart attack or stroke. You cannot feel your cholesterol level. Having a blood test is the only way to find out if your level is high. Keep your test results. Work with your doctor to keep your cholesterol at a good level. What do the results mean?  Total cholesterol is how much cholesterol is in your blood.  LDL is bad cholesterol. This is the type that can build up. Try to have low LDL.  HDL is good cholesterol. It cleans your blood vessels and carries LDL away. Try to have high HDL.  Triglycerides are fat that the body can store or burn for energy. What are good levels of cholesterol?  Total cholesterol below 200.  LDL below 100 is good for people who have health risks. LDL below 70 is good for people who have very high risks.  HDL above 40 is good. It is best to have HDL of 60 or higher.  Triglycerides below 150. How can I lower my cholesterol? Diet Follow your diet program as told by your doctor.  Choose fish, white meat chicken, or [place] that is roasted or baked. Try not to eat red meat, fried foods, sausage, or lunch meats.  Eat lots of fresh fruits and vegetables.  Choose whole grains, beans, pasta, potatoes, and cereals.  Choose olive oil, corn oil, or canola oil. Only use small amounts.  Try not to eat butter, mayonnaise, shortening, or palm kernel oils.  Try not to eat foods with trans fats.  Choose low-fat or nonfat dairy foods. ? Drink skim or nonfat milk. ? Eat low-fat or nonfat yogurt and cheeses. ? Try not to drink whole milk or cream. ? Try not to eat ice cream, egg yolks, or full-fat cheeses.  Healthy desserts include angel food cake, ginger snaps,  animal crackers, hard candy, popsicles, and low-fat or nonfat frozen yogurt. Try not to eat pastries, cakes, pies, and cookies.  Exercise Follow your exercise program as told by your doctor.  Be more active. Try gardening, walking, and taking the stairs.  Ask your doctor about ways that you can be more active. Medicine  Take over-the-counter and prescription medicines only as told by your doctor. This information is not intended to replace advice given to you by your health care provider. Make sure you discuss any questions you have with your health care provider. Document Released: 09/30/2008 Document Revised: 02/03/2016 Document Reviewed: 01/14/2016 Elsevier Interactive Patient Education  2019 [employer].

## 2018-09-14 NOTE — Assessment & Plan Note (Signed)
LDL-c remains above desired goal for high risk patient. Noted history of severe muscle pain with rosuvastatin, pravastatin, and pitavastatin. Patient reports compliance with low fat diet, ezetimibe, and omega-3.  PCSK9i is an appropriate next step in therapy. She still working on smoking cessation and reports smoking less cigarettes every day.   Repatha/Praleunt indication, MOA, PA- process, co-pay card programs, storage, administration, and common side effects were discussed during this OV. Will start paperwork for Repatha SureClick 140mg  approval and follow up with patient as needed.

## 2018-09-18 ENCOUNTER — Telehealth: Payer: Self-pay

## 2018-09-18 ENCOUNTER — Other Ambulatory Visit: Payer: Self-pay | Admitting: Pharmacist

## 2018-09-18 MED ORDER — EVOLOCUMAB 140 MG/ML ~~LOC~~ SOAJ: 140.0000 mg | SUBCUTANEOUS | 11 refills | Status: DC

## 2018-09-18 NOTE — Telephone Encounter (Signed)
Called left msg regarding repatha stating that we sent the rx to [facility] and that she should have the copay card that raquel gave her and if not to call us back and we will talk her through how to register online for that so that it will make the cost of the repatha about $5

## 2019-02-25 ENCOUNTER — Telehealth: Payer: Self-pay

## 2019-02-25 NOTE — Telephone Encounter (Signed)
Called and lmomed the pt regarding needing labs for pa of repatha awaiting a callback from pt

## 2019-05-03 ENCOUNTER — Telehealth: Payer: Self-pay | Admitting: Cardiology

## 2019-05-03 NOTE — Telephone Encounter (Signed)
Ok to refill Repatha.  Peter Martinique MD, [facility]

## 2019-05-03 NOTE — Telephone Encounter (Signed)
° ° ° °*  STAT* If patient is at the pharmacy, call can be transferred to refill team.   1. Which medications need to be refilled? (please list name of each medication and dose if known) Evolocumab (REPATHA SURECLICK) 754 MG/ML SOAJ  2. Which pharmacy/location (including street and city if local pharmacy) is medication to be sent to? [place]  3. Do they need a 30 day or 90 day supply? [place]

## 2019-05-09 ENCOUNTER — Telehealth: Payer: Self-pay

## 2019-05-09 NOTE — Telephone Encounter (Signed)
Received [employer] prior authorization request form for Repatha.Dr.Jordan completed form.Form faxed to fax # [phone].

## 2019-05-15 MED ORDER — REPATHA SURECLICK 140 MG/ML ~~LOC~~ SOAJ: 140.0000 mg | SUBCUTANEOUS | 5 refills | Status: DC

## 2019-05-28 ENCOUNTER — Telehealth: Payer: Self-pay | Admitting: Cardiology

## 2019-05-28 NOTE — Telephone Encounter (Signed)
PA for Repatha expired and no fasting blood work completed yet. Patient will come to pick up sample this week, while we get fasting blood work and re-new her PA.   Medication Samples have been provided to the patient.  Drug name: Repatha SureClick    Strength: 140mg        Qty: 1  LOT: [id number]  Exp.Date: 3/23    Crockett Rallo Rodriguez-Guzman PharmD, BCPS, [place] [address] [place] [address] 05/28/2019 12:23 PM

## 2019-05-28 NOTE — Telephone Encounter (Signed)
New message   Patient would like a call to discuss repatha. She states that it is going to cost her $600.00. Please call to discuss.

## 2019-05-29 ENCOUNTER — Telehealth: Payer: Self-pay

## 2019-05-29 ENCOUNTER — Telehealth: Payer: Self-pay | Admitting: Cardiology

## 2019-05-29 DIAGNOSIS — E7800 Pure hypercholesterolemia, unspecified: Secondary | ICD-10-CM

## 2019-05-29 NOTE — Telephone Encounter (Signed)
Called and lmomed the pt stated that yes she did have them done in June after calling [facility]. Those labs are still outdated compared to what cover [employer] expects since they require them from 120 days prior of when the renewal is sent in. Will await a callback.

## 2019-05-29 NOTE — Telephone Encounter (Signed)
Roselie Awkward, patients sister, will be picking up her sample of  Evolocumab (REPATHA SURECLICK) 383 MG/ML SOAJ

## 2019-06-06 ENCOUNTER — Other Ambulatory Visit: Payer: Self-pay

## 2019-06-06 LAB — LIPID PANEL
Chol/HDL Ratio: 3.7 ratio (ref 0.0–4.4)
Cholesterol, Total: 175 mg/dL (ref 100–199)
HDL: 47 mg/dL (ref >39)
LDL Chol Calc (NIH): 112 mg/dL — ABNORMAL HIGH (ref 0–99)
Triglycerides: 84 mg/dL (ref 0–149)
VLDL Cholesterol Cal: 16 mg/dL (ref 5–40)

## 2019-08-12 NOTE — Progress Notes (Signed)
[name] Section Date of Birth: 06-16-1966 Medical Record #[id number]  History of Present Illness: April [name] is seen today for followup thoracic aneurysm. She status post thoracic aneurysm repair in 2007 by Dr. Cyndia Bent. No CAD at that time. :Last follow up CT in October 2016. She has a history of HTN, HLD, and tobacco abuse. On her last visit she was started on Repatha for persistently elevated cholesterol on Zetia. Intolerant of statins.    On follow up today she reports she is doing well. She had to stop working in October to care for her husband who has stage 4 pancreatic cancer. As such she is less active and has gained weight. Denies any chest pain or dyspnea. Is still smoking but has cut back.   Allergies as of 08/19/2019   No Known Allergies     Medication List       Accurate as of August 19, 2019 10:12 AM. If you have any questions, ask your nurse or doctor.        STOP taking these medications   ezetimibe 10 MG tablet Commonly known as: ZETIA Stopped by: Daysy Santini Martinique, MD     TAKE these medications   aspirin EC 81 MG tablet Take 81 mg by mouth daily.   Fish Oil 1000 MG Caps Take 2 capsules by mouth.   HYDROcodone-acetaminophen 5-325 MG tablet Commonly known as: NORCO/VICODIN Take 1 tablet by mouth every 4 (four) hours as needed.   omeprazole 20 MG capsule Commonly known as: PRILOSEC TAKE 1 CAPSULE BY MOUTH ONCE DAILY (NEEDS APPOIINTMENT FOR REFILLS)   Repatha SureClick 814 MG/ML Soaj Generic drug: Evolocumab Inject 140 mg into the skin every 14 (fourteen) days.       No Known Allergies  Past Medical History:  Diagnosis Date  . Hypercholesterolemia   . Hypertension   . Thoracic aortic aneurysm (HCC)    Ascending. Status post repair in April 2007  . Tobacco dependence     Past Surgical History:  Procedure Laterality Date  . DESCENDING AORTIC ANEURYSM REPAIR  2007   Status post ascending with a #27mm Hemashield tube graft in April 2007.  . TUBAL  LIGATION      Social History   Tobacco Use  Smoking Status Current Every Day Smoker  . Packs/day: 1.00  . Types: Cigarettes  Smokeless Tobacco Never Used    Social History   Substance and Sexual Activity  Alcohol Use No  . Alcohol/week: 0.0 standard drinks    Family History  Problem Relation Age of Onset  . Hypertension Mother   . Diabetes Brother     Review of Systems: As noted in history of present illness.  All other systems were reviewed and are negative.  Physical Exam: BP 132/80 (BP Location: Left Arm, Patient Position: Sitting, Cuff Size: Normal)   Pulse 70   Temp (!) 97.3 F (36.3 C)   Ht 5\' 4"  (1.626 m)   Wt 198 lb (89.8 kg)   BMI 33.99 kg/m  GENERAL:  Well appearing WF in NAD HEENT:  PERRL, EOMI, sclera are clear. Oropharynx is clear. NECK:  No jugular venous distention, carotid upstroke brisk and symmetric, no bruits, no thyromegaly or adenopathy LUNGS:  Clear to auscultation bilaterally CHEST:  Unremarkable HEART:  RRR,  PMI not displaced or sustained,S1 and S2 within normal limits, no S3, no S4: no clicks, no rubs, no murmurs ABD:  Soft, nontender. BS +, no masses or bruits. No hepatomegaly, no splenomegaly EXT:  2 + pulses  throughout, no edema, no cyanosis no clubbing SKIN:  Warm and dry.  No rashes NEURO:  Alert and oriented x 3. Cranial nerves II through XII intact. PSYCH:  Cognitively intact      LABORATORY DATA: Lab Results  Component Value Date   WBC 8.4 04/23/2015   HGB 14.6 04/23/2015   HCT 43.0 04/23/2015   PLT 262 04/23/2015   GLUCOSE 90 04/23/2015   CHOL 175 06/06/2019   TRIG 84 06/06/2019   HDL 47 06/06/2019   LDLCALC 112 (H) 06/06/2019   ALT 17 04/23/2015   AST 20 04/23/2015   NA 143 04/23/2015   K 4.8 04/23/2015   CL 106 04/23/2015   CREATININE 0.61 04/23/2015   BUN 16 04/23/2015   CO2 29 04/23/2015   Labs dated 04/21/16: Normal chemistries and CBC. Cholesterol 172, triglycerides 95, HDL 53, LDL 100. Dated 05/04/17:  Normal CBC and CMET. Cholesterol 167, triglycerides 96, HDL 51, LDL 97. Dated 07/27/17: cholesterol 164, triglycerides 74, HDL 53, LDL 96. Glucose 105. Otherwise CBC and CMET normal.  Dated 04/04/18: cholesterol 257, triglycerides 112, HDL 49, LDL 186. CBC and CMET normal. Dated 07/20/18: cholesterol 241, triglycerides 88, HDL 52. LDL 177. CMET and CBC normal.  Dated 12/18/18: cholesterol 181, triglycerides 136, HDL 52, LDL 102   Ecg today shows NSR with rate 70 . Normal.   I have personally reviewed and interpreted this study.   Assessment / Plan: 1. Thoracic aortic aneurysm status post repair in 2007. Patient is asymptomatic. CT in October 2016 showed good result. Continue risk factor modification.  2. Hypercholesterolemia. Goal LDL is less than 100. Developed side effects on Crestor. Now on Repatha and tolerating well. Dropped her LDL by 70-80 points.   3. Tobacco abuse. Encouraged complete smoking cessation.  4. Obesity. Encouraged increase aerobic activity and dietary modification.  Follow up in one year.

## 2019-08-19 ENCOUNTER — Encounter: Payer: Self-pay | Admitting: Cardiology

## 2019-08-19 ENCOUNTER — Encounter (INDEPENDENT_AMBULATORY_CARE_PROVIDER_SITE_OTHER): Payer: Self-pay

## 2019-08-19 ENCOUNTER — Ambulatory Visit (INDEPENDENT_AMBULATORY_CARE_PROVIDER_SITE_OTHER): Payer: BC Managed Care – PPO | Admitting: Cardiology

## 2019-08-19 ENCOUNTER — Other Ambulatory Visit: Payer: Self-pay

## 2019-08-19 VITALS — BP 132/80 | HR 70 | Temp 97.3°F | Ht 64.0 in | Wt 198.0 lb

## 2019-08-19 DIAGNOSIS — Z9889 Other specified postprocedural states: Secondary | ICD-10-CM | POA: Diagnosis not present

## 2019-08-19 DIAGNOSIS — Z720 Tobacco use: Secondary | ICD-10-CM | POA: Diagnosis not present

## 2019-08-19 DIAGNOSIS — E7800 Pure hypercholesterolemia, unspecified: Secondary | ICD-10-CM | POA: Diagnosis not present

## 2019-08-19 DIAGNOSIS — Z8679 Personal history of other diseases of the circulatory system: Secondary | ICD-10-CM | POA: Diagnosis not present

## 2019-10-02 ENCOUNTER — Telehealth: Payer: Self-pay | Admitting: Cardiology

## 2019-10-02 NOTE — Telephone Encounter (Signed)
° °  Pt c/o medication issue:  1. Name of Medication:   Evolocumab (REPATHA SURECLICK) 140 MG/ML SOAJ    2. How are you currently taking this medication (dosage and times per day)?   3. Are you having a reaction (difficulty breathing--STAT)?   4. What is your medication issue? Pt said the discount card she use to afford Repatha has ran out and would like to get another one, she said her next dose will be on 10/10/19  Please call

## 2019-10-02 NOTE — Telephone Encounter (Signed)
For you -

## 2019-10-02 NOTE — Telephone Encounter (Signed)
Called and spoke w/pt regarding the message that they had left stating that they are having issues getting their repatha copay card to work. I instructed the pt to call [phone]  to get the situation resolved. The pt voiced understanding.

## 2019-11-25 ENCOUNTER — Other Ambulatory Visit: Payer: Self-pay | Admitting: Cardiology

## 2020-03-04 ENCOUNTER — Telehealth: Payer: Self-pay | Admitting: Cardiology

## 2020-03-04 NOTE — Telephone Encounter (Signed)
Returned a call to the patient but had to lmom her to call us back I didn't go into detail but I would like to let her know when she calls back that her amount was capped out at $3500 for the copay card and that is why she has such a huge amount to pay. I left the number to callback so that we may offer samples for this month and inform her that she will have to self pay for next month

## 2020-03-04 NOTE — Telephone Encounter (Signed)
Patient states she went to go pick up her REPATHA SURECLICK 140 MG/ML SOAJ prescription - she states where it is normally $5, it was $272 when she went to go pick it up. She is calling to see if there is anyway for that price to come back down. Please advise.

## 2020-03-24 ENCOUNTER — Telehealth: Payer: Self-pay | Admitting: Cardiology

## 2020-03-24 NOTE — Telephone Encounter (Signed)
Patient calling the office for samples of medication:   1.  What medication and dosage are you requesting samples for? REPATHA SURECLICK 140 MG/ML SOAJ  2.  Are you currently out of this medication? yes

## 2020-03-24 NOTE — Telephone Encounter (Signed)
Spoke to patient she stated her husband recently passed away with pancreatic ca.Stated she has had a lot going on and would like to have Repatha samples.Advised I will send message to our pharmacist.

## 2020-03-24 NOTE — Telephone Encounter (Signed)
I tried to call the pt back to let them know that the samples were placed but I was unable to leave a msg

## 2020-04-03 LAB — LIPID PANEL
Chol/HDL Ratio: 4.1 ratio (ref 0.0–4.4)
Cholesterol, Total: 187 mg/dL (ref 100–199)
HDL: 46 mg/dL (ref >39)
LDL Chol Calc (NIH): 126 mg/dL — ABNORMAL HIGH (ref 0–99)
Triglycerides: 82 mg/dL (ref 0–149)
VLDL Cholesterol Cal: 15 mg/dL (ref 5–40)

## 2020-04-03 LAB — HEPATIC FUNCTION PANEL
ALT: 14 IU/L (ref 0–32)
AST: 15 IU/L (ref 0–40)
Albumin: 4.2 g/dL (ref 3.8–4.9)
Alkaline Phosphatase: 84 IU/L (ref 44–121)
Bilirubin Total: 0.3 mg/dL (ref 0.0–1.2)
Bilirubin, Direct: <0.1 mg/dL (ref 0.00–0.40)
Total Protein: 7.4 g/dL (ref 6.0–8.5)

## 2020-04-03 LAB — BASIC METABOLIC PANEL
BUN/Creatinine Ratio: 14 (ref 9–23)
BUN: 11 mg/dL (ref 6–24)
CO2: 24 mmol/L (ref 20–29)
Calcium: 9.7 mg/dL (ref 8.7–10.2)
Chloride: 104 mmol/L (ref 96–106)
Creatinine, Ser: 0.77 mg/dL (ref 0.57–1.00)
GFR calc Af Amer: 101 mL/min/{1.73_m2} (ref >59)
GFR calc non Af Amer: 88 mL/min/{1.73_m2} (ref >59)
Glucose: 89 mg/dL (ref 65–99)
Potassium: 4.9 mmol/L (ref 3.5–5.2)
Sodium: 142 mmol/L (ref 134–144)

## 2020-04-24 ENCOUNTER — Other Ambulatory Visit: Payer: Self-pay | Admitting: Cardiology

## 2020-04-24 MED ORDER — REPATHA SURECLICK 140 MG/ML ~~LOC~~ SOAJ: 140.0000 mg | SUBCUTANEOUS | 4 refills | Status: DC

## 2020-04-24 NOTE — Telephone Encounter (Signed)
*  STAT* If patient is at the pharmacy, call can be transferred to refill team.   1. Which medications need to be refilled? (please list name of each medication and dose if known)  REPATHA SURECLICK 140 MG/ML SOAJ  2. Which pharmacy/location (including street and city if local pharmacy) is medication to be sent to? [facility] [address] [place] - [address]  3. Do they need a 30 day or 90 day supply? 30 day

## 2020-05-22 ENCOUNTER — Telehealth: Payer: Self-pay | Admitting: Cardiology

## 2020-05-22 NOTE — Telephone Encounter (Signed)
We can provide 2 sample at this time. We tried to call her in September ,but never got call back.    Drug name: Repatha SureClick      Strength:140mg       Qty: 2  LOT: [id number]  Exp.Date:08/23

## 2020-05-22 NOTE — Telephone Encounter (Signed)
Patient calling the office for samples of medication:   1.  What medication and dosage are you requesting samples for? Evolocumab (REPATHA SURECLICK) 140 MG/ML SOAJ [[id number]   2.  Are you currently out of this medication?  Pt stated she is currently out of this med and can not afford to buy it ,  It is $274.00.    Best number [phone]

## 2020-05-22 NOTE — Telephone Encounter (Signed)
Spoke with patient, informed patient 2 Repatha sureclicks have been placed at the front for pick up. Patient verbalized understanding.

## 2020-07-13 ENCOUNTER — Telehealth: Payer: Self-pay | Admitting: Cardiology

## 2020-07-13 NOTE — Telephone Encounter (Signed)
lmomed the pt that we would leave a sample for them in the fridge.

## 2020-07-13 NOTE — Telephone Encounter (Signed)
Patient calling the office for samples of medication:   1.  What medication and dosage are you requesting samples for? Evolocumab (REPATHA SURECLICK) 140 MG/ML SOAJ  2.  Are you currently out of this medication? Yes   Patient said she is due to give herself another injection tomorrow (07/14/20). Patient can not afford to pay the retail price. Her discount card does not kick in until January

## 2020-07-27 NOTE — Progress Notes (Signed)
April [name] Date of Birth: October 20, 1965 Medical Record #[id number]  History of Present Illness: April [name] is seen today for followup thoracic aneurysm. She status post thoracic aneurysm repair in 2007 by Dr. Laneta Simmers. No CAD at that time. Last follow up CT in October 2016. She has a history of HTN, HLD, and tobacco abuse. She is on Repatha for persistently elevated cholesterol on Zetia. Intolerant of statins.    On follow up today she reports this has been a very stressful year. Lost her husband to pancreatic cancer. She has a new job that is stressful. She has lost 30 lbs. Other than stress she feels well with infrequent chest tightness.   Allergies as of 07/30/2020   No Known Allergies     Medication List       Accurate as of July 30, 2020  8:57 AM. If you have any questions, ask your nurse or doctor.        aspirin EC 81 MG tablet Take 81 mg by mouth daily.   Fish Oil 1000 MG Caps Take 2 capsules by mouth.   HYDROcodone-acetaminophen 5-325 MG tablet Commonly known as: NORCO/VICODIN Take 1 tablet by mouth every 4 (four) hours as needed.   omeprazole 20 MG capsule Commonly known as: PRILOSEC TAKE 1 CAPSULE BY MOUTH ONCE DAILY (NEEDS APPOIINTMENT FOR REFILLS)   Repatha SureClick 140 MG/ML Soaj Generic drug: Evolocumab Inject 140 mg as directed every 14 (fourteen) days.       No Known Allergies  Past Medical History:  Diagnosis Date  . Hypercholesterolemia   . Hypertension   . Thoracic aortic aneurysm (HCC)    Ascending. Status post repair in April 2007  . Tobacco dependence     Past Surgical History:  Procedure Laterality Date  . DESCENDING AORTIC ANEURYSM REPAIR  2007   Status post ascending with a #4mm Hemashield tube graft in April 2007.  . TUBAL LIGATION      Social History   Tobacco Use  Smoking Status Current Every Day Smoker  . Packs/day: 1.00  . Types: Cigarettes  Smokeless Tobacco Never Used    Social History   Substance and Sexual  Activity  Alcohol Use No  . Alcohol/week: 0.0 standard drinks    Family History  Problem Relation Age of Onset  . Hypertension Mother   . Diabetes Brother     Review of Systems: As noted in history of present illness.  All other systems were reviewed and are negative.  Physical Exam: BP 130/72   Pulse (!) 59   Ht 5\' 4"  (1.626 m)   Wt 168 lb (76.2 kg)   SpO2 97%   BMI 28.84 kg/m  GENERAL:  Well appearing WF in NAD HEENT:  PERRL, EOMI, sclera are clear. Oropharynx is clear. NECK:  No jugular venous distention, carotid upstroke brisk and symmetric, no bruits, no thyromegaly or adenopathy LUNGS:  Clear to auscultation bilaterally CHEST:  Unremarkable HEART:  RRR,  PMI not displaced or sustained,S1 and S2 within normal limits, no S3, no S4: no clicks, no rubs, no murmurs ABD:  Soft, nontender. BS +, no masses or bruits. No hepatomegaly, no splenomegaly EXT:  2 + pulses throughout, no edema, no cyanosis no clubbing SKIN:  Warm and dry.  No rashes NEURO:  Alert and oriented x 3. Cranial nerves II through XII intact. PSYCH:  Cognitively intact      LABORATORY DATA: Lab Results  Component Value Date   WBC 8.4 04/23/2015   HGB 14.6 04/23/2015  HCT 43.0 04/23/2015   PLT 262 04/23/2015   GLUCOSE 89 04/01/2020   CHOL 187 04/01/2020   TRIG 82 04/01/2020   HDL 46 04/01/2020   LDLCALC 126 (H) 04/01/2020   ALT 14 04/01/2020   AST 15 04/01/2020   NA 142 04/01/2020   K 4.9 04/01/2020   CL 104 04/01/2020   CREATININE 0.77 04/01/2020   BUN 11 04/01/2020   CO2 24 04/01/2020   Labs dated 04/21/16: Normal chemistries and CBC. Cholesterol 172, triglycerides 95, HDL 53, LDL 100. Dated 05/04/17: Normal CBC and CMET. Cholesterol 167, triglycerides 96, HDL 51, LDL 97. Dated 07/27/17: cholesterol 164, triglycerides 74, HDL 53, LDL 96. Glucose 105. Otherwise CBC and CMET normal.  Dated 04/04/18: cholesterol 257, triglycerides 112, HDL 49, LDL 186. CBC and CMET normal. Dated 07/20/18:  cholesterol 241, triglycerides 88, HDL 52. LDL 177. CMET and CBC normal.  Dated 12/18/18: cholesterol 181, triglycerides 136, HDL 52, LDL 102   Ecg today shows NSR with rate 60 . Normal.   I have personally reviewed and interpreted this study.   Assessment / Plan: 1. Thoracic aortic aneurysm status post repair in 2007. Patient is asymptomatic. CT in October 2016 showed good result. Continue risk factor modification.  2. Hypercholesterolemia. Goal LDL is less than 100. Developed side effects on Crestor. Now on Repatha and tolerating well. Generally good results when she takes it.   3. Tobacco abuse. Encouraged complete smoking cessation.  4. Obesity. Congratulated on weight loss.   Follow up in one year.

## 2020-07-30 ENCOUNTER — Other Ambulatory Visit: Payer: Self-pay

## 2020-07-30 ENCOUNTER — Telehealth: Payer: Self-pay

## 2020-07-30 ENCOUNTER — Ambulatory Visit: Payer: BC Managed Care – PPO | Admitting: Cardiology

## 2020-07-30 ENCOUNTER — Encounter: Payer: Self-pay | Admitting: Cardiology

## 2020-07-30 VITALS — BP 130/72 | HR 59 | Ht 64.0 in | Wt 168.0 lb

## 2020-07-30 DIAGNOSIS — Z720 Tobacco use: Secondary | ICD-10-CM | POA: Diagnosis not present

## 2020-07-30 DIAGNOSIS — Z9889 Other specified postprocedural states: Secondary | ICD-10-CM

## 2020-07-30 DIAGNOSIS — Z8679 Personal history of other diseases of the circulatory system: Secondary | ICD-10-CM

## 2020-07-30 DIAGNOSIS — E7800 Pure hypercholesterolemia, unspecified: Secondary | ICD-10-CM

## 2020-07-30 NOTE — Telephone Encounter (Signed)
Called and lmomed the pt that they need to contact [phone] regarding their copay card issue

## 2020-08-14 ENCOUNTER — Telehealth: Payer: Self-pay | Admitting: Cardiology

## 2020-08-14 MED ORDER — REPATHA SURECLICK 140 MG/ML ~~LOC~~ SOAJ: 140.0000 mg | SUBCUTANEOUS | 3 refills | Status: DC

## 2020-08-14 NOTE — Telephone Encounter (Signed)
*  STAT* If patient is at the pharmacy, call can be transferred to refill team.   1. Which medications need to be refilled? (please list name of each medication and dose if known) Evolocumab (REPATHA SURECLICK) 140 MG/ML SOAJ  2. Which pharmacy/location (including street and city if local pharmacy) is medication to be sent to?  [facility] - [place], [place] - [address], [address] Phone:  [phone]          3. Do they need a 30 day or 90 day supply? 90 day

## 2021-02-21 ENCOUNTER — Other Ambulatory Visit: Payer: Self-pay | Admitting: Cardiology

## 2021-07-16 ENCOUNTER — Other Ambulatory Visit: Payer: Self-pay

## 2021-07-16 DIAGNOSIS — E7800 Pure hypercholesterolemia, unspecified: Secondary | ICD-10-CM

## 2021-07-30 NOTE — Progress Notes (Signed)
[name] Date of Birth: 1965/09/16 Medical Record #[id number]  History of Present Illness: April [name] is seen today for followup thoracic aneurysm. She status post thoracic aneurysm repair in 2007 by Dr. Laneta Simmers. No CAD at that time. Last follow up CT in October 2016. She has a history of HTN, HLD, and tobacco abuse. She is on Repatha. Intolerant of statins. Zetia did not change her cholesterol much.    On follow up today she reports she is doing well. No chest pain or SOB. Still smoking a little.  She is working and keeping her weight down.  Allergies as of 08/13/2021   No Known Allergies      Medication List        Accurate as of August 13, 2021  4:07 PM. If you have any questions, ask your nurse or doctor.          aspirin EC 81 MG tablet Take 81 mg by mouth daily.   Fish Oil 1000 MG Caps Take 2 capsules by mouth.   HYDROcodone-acetaminophen 5-325 MG tablet Commonly known as: NORCO/VICODIN Take 1 tablet by mouth every 4 (four) hours as needed.   omeprazole 20 MG capsule Commonly known as: PRILOSEC TAKE 1 CAPSULE BY MOUTH ONCE DAILY (NEEDS APPOIINTMENT FOR REFILLS)   Repatha SureClick 140 MG/ML Soaj Generic drug: Evolocumab INJECT 140 MG AS DIRECTED EVERY 14 DAYS        No Known Allergies  Past Medical History:  Diagnosis Date   Hypercholesterolemia    Hypertension    Thoracic aortic aneurysm    Ascending. Status post repair in April 2007   Tobacco dependence     Past Surgical History:  Procedure Laterality Date   DESCENDING AORTIC ANEURYSM REPAIR  2007   Status post ascending with a #89mm Hemashield tube graft in April 2007.   TUBAL LIGATION      Social History   Tobacco Use  Smoking Status Every Day   Packs/day: 1.00   Types: Cigarettes  Smokeless Tobacco Never    Social History   Substance and Sexual Activity  Alcohol Use No   Alcohol/week: 0.0 standard drinks    Family History  Problem Relation Age of Onset   Hypertension  Mother    Diabetes Brother     Review of Systems: As noted in history of present illness.  All other systems were reviewed and are negative.  Physical Exam: BP 126/80 (BP Location: Left Arm, Patient Position: Sitting, Cuff Size: Normal)    Pulse 87    Resp 20    Ht 5\' 4"  (1.626 m)    Wt 168 lb (76.2 kg)    SpO2 97%    BMI 28.84 kg/m  GENERAL:  Well appearing WF in NAD HEENT:  PERRL, EOMI, sclera are clear. Oropharynx is clear. NECK:  No jugular venous distention, carotid upstroke brisk and symmetric, no bruits, no thyromegaly or adenopathy LUNGS:  Clear to auscultation bilaterally CHEST:  Unremarkable HEART:  RRR,  PMI not displaced or sustained,S1 and S2 within normal limits, no S3, no S4: no clicks, no rubs, no murmurs ABD:  Soft, nontender. BS +, no masses or bruits. No hepatomegaly, no splenomegaly EXT:  2 + pulses throughout, no edema, no cyanosis no clubbing SKIN:  Warm and dry.  No rashes NEURO:  Alert and oriented x 3. Cranial nerves II through XII intact. PSYCH:  Cognitively intact      LABORATORY DATA: Lab Results  Component Value Date   WBC 8.4 04/23/2015  HGB 14.6 04/23/2015   HCT 43.0 04/23/2015   PLT 262 04/23/2015   GLUCOSE 89 04/01/2020   CHOL 206 (H) 08/10/2021   TRIG 86 08/10/2021   HDL 56 08/10/2021   LDLCALC 135 (H) 08/10/2021   ALT 12 08/10/2021   AST 16 08/10/2021   NA 142 04/01/2020   K 4.9 04/01/2020   CL 104 04/01/2020   CREATININE 0.77 04/01/2020   BUN 11 04/01/2020   CO2 24 04/01/2020   Labs dated 04/21/16: Normal chemistries and CBC. Cholesterol 172, triglycerides 95, HDL 53, LDL 100. Dated 05/04/17: Normal CBC and CMET. Cholesterol 167, triglycerides 96, HDL 51, LDL 97. Dated 07/27/17: cholesterol 164, triglycerides 74, HDL 53, LDL 96. Glucose 105. Otherwise CBC and CMET normal.  Dated 04/04/18: cholesterol 257, triglycerides 112, HDL 49, LDL 186. CBC and CMET normal. Dated 07/20/18: cholesterol 241, triglycerides 88, HDL 52. LDL 177. CMET  and CBC normal.  Dated 12/18/18: cholesterol 181, triglycerides 136, HDL 52, LDL 102   Ecg today shows NSR with rate 60 . Nonspecific ST abnormality.   I have personally reviewed and interpreted this study.   Assessment / Plan: 1. Thoracic aortic aneurysm status post repair in 2007. Patient is asymptomatic. CT in October 2016 showed good result. Continue risk factor modification.  2. Hypercholesterolemia. Goal LDL is less than 100. Intolerant of statins.  Now on Repatha and tolerating well. Encourage compliance.   3. Tobacco abuse. Encouraged complete smoking cessation.  4. Obesity. Congratulated on weight loss.   Follow up in one year.

## 2021-08-10 LAB — LIPID PANEL
Chol/HDL Ratio: 3.7 ratio (ref 0.0–4.4)
Cholesterol, Total: 206 mg/dL — ABNORMAL HIGH (ref 100–199)
HDL: 56 mg/dL (ref >39)
LDL Chol Calc (NIH): 135 mg/dL — ABNORMAL HIGH (ref 0–99)
Triglycerides: 86 mg/dL (ref 0–149)
VLDL Cholesterol Cal: 15 mg/dL (ref 5–40)

## 2021-08-10 LAB — HEPATIC FUNCTION PANEL
ALT: 12 IU/L (ref 0–32)
AST: 16 IU/L (ref 0–40)
Albumin: 4.3 g/dL (ref 3.8–4.9)
Alkaline Phosphatase: 69 IU/L (ref 44–121)
Bilirubin Total: 0.3 mg/dL (ref 0.0–1.2)
Bilirubin, Direct: <0.1 mg/dL (ref 0.00–0.40)
Total Protein: 7.2 g/dL (ref 6.0–8.5)

## 2021-08-13 ENCOUNTER — Ambulatory Visit: Payer: BC Managed Care – PPO | Admitting: Cardiology

## 2021-08-13 ENCOUNTER — Encounter: Payer: Self-pay | Admitting: Cardiology

## 2021-08-13 ENCOUNTER — Other Ambulatory Visit: Payer: Self-pay | Admitting: Pharmacist

## 2021-08-13 ENCOUNTER — Other Ambulatory Visit: Payer: Self-pay

## 2021-08-13 VITALS — BP 126/80 | HR 87 | Resp 20 | Ht 64.0 in | Wt 168.0 lb

## 2021-08-13 DIAGNOSIS — E7800 Pure hypercholesterolemia, unspecified: Secondary | ICD-10-CM

## 2021-08-13 DIAGNOSIS — Z720 Tobacco use: Secondary | ICD-10-CM | POA: Diagnosis not present

## 2021-08-13 DIAGNOSIS — Z8679 Personal history of other diseases of the circulatory system: Secondary | ICD-10-CM | POA: Diagnosis not present

## 2021-08-13 DIAGNOSIS — Z9889 Other specified postprocedural states: Secondary | ICD-10-CM | POA: Diagnosis not present

## 2021-08-13 MED ORDER — REPATHA SURECLICK 140 MG/ML ~~LOC~~ SOAJ: SUBCUTANEOUS | 11 refills | Status: DC

## 2021-08-18 NOTE — Addendum Note (Signed)
Addended by: Eather Colas on: 08/18/2021 09:49 AM   Modules accepted: Orders

## 2021-10-13 ENCOUNTER — Other Ambulatory Visit: Payer: Self-pay | Admitting: Cardiology

## 2022-05-27 ENCOUNTER — Telehealth: Payer: Self-pay | Admitting: Pharmacist

## 2022-05-27 NOTE — Telephone Encounter (Signed)
PA renewal request received Key: [id number] Submitted to [facility]

## 2022-06-07 ENCOUNTER — Encounter: Payer: Self-pay | Admitting: Adult Health Nurse Practitioner

## 2022-07-07 ENCOUNTER — Encounter: Payer: Self-pay | Admitting: Adult Health Nurse Practitioner

## 2022-08-17 NOTE — Progress Notes (Signed)
[name] Section Date of Birth: [birth year] Medical Record [id number]  History of Present Illness: April [name] is seen today for followup thoracic aneurysm. She status post thoracic aneurysm repair in 2007 by Dr. Cyndia Bent. No CAD at that time. Last follow up CT in October 2016. She has a history of HTN, HLD, and tobacco abuse. She is on Repatha. Intolerant of statins. Zetia did not change her cholesterol much.    On follow up today she reports she is doing well. No chest pain or SOB. Still smoking- finds her job stressful.    Allergies as of 08/13/2021   No Known Allergies      Medication List        Accurate as of August 13, 2021  4:07 PM. If you have any questions, ask your nurse or doctor.          aspirin EC 81 MG tablet Take 81 mg by mouth daily.   Fish Oil 1000 MG Caps Take 2 capsules by mouth.   HYDROcodone-acetaminophen 5-325 MG tablet Commonly known as: NORCO/VICODIN Take 1 tablet by mouth every 4 (four) hours as needed.   omeprazole 20 MG capsule Commonly known as: PRILOSEC TAKE 1 CAPSULE BY MOUTH ONCE DAILY (NEEDS APPOIINTMENT FOR REFILLS)   Repatha SureClick [id number] MG/ML Soaj Generic drug: Evolocumab INJECT 140 MG AS DIRECTED EVERY 14 DAYS        No Known Allergies  Past Medical History:  Diagnosis Date   Hypercholesterolemia    Hypertension    Thoracic aortic aneurysm    Ascending. Status post repair in April 2007   Tobacco dependence     Past Surgical History:  Procedure Laterality Date   DESCENDING AORTIC ANEURYSM REPAIR  2007   Status post ascending with a #38m Hemashield tube graft in April 2007.   TUBAL LIGATION      Social History   Tobacco Use  Smoking Status Every Day   Packs/day: 1.00   Types: Cigarettes  Smokeless Tobacco Never    Social History   Substance and Sexual Activity  Alcohol Use No   Alcohol/week: 0.0 standard drinks    Family History  Problem Relation Age of Onset   Hypertension Mother    Diabetes Brother      Review of Systems: As noted in history of present illness.  All other systems were reviewed and are negative.  Physical Exam: BP 126/80 (BP Location: Left Arm, Patient Position: Sitting, Cuff Size: Normal)   Pulse 87   Resp 20   Ht 5' 4"$  (1.626 m)   Wt 168 lb (76.2 kg)   SpO2 97%   BMI 28.84 kg/m  GENERAL:  Well appearing WF in NAD HEENT:  PERRL, EOMI, sclera are clear. Oropharynx is clear. NECK:  No jugular venous distention, carotid upstroke brisk and symmetric, no bruits, no thyromegaly or adenopathy LUNGS:  Clear to auscultation bilaterally CHEST:  Unremarkable HEART:  RRR,  PMI not displaced or sustained,S1 and S2 within normal limits, no S3, no S4: no clicks, no rubs. Soft SEM. ABD:  Soft, nontender. BS +, no masses or bruits. No hepatomegaly, no splenomegaly EXT:  2 + pulses throughout, no edema, no cyanosis no clubbing SKIN:  Warm and dry.  No rashes NEURO:  Alert and oriented x 3. Cranial nerves II through XII intact. PSYCH:  Cognitively intact      LABORATORY DATA: Lab Results  Component Value Date   WBC 8.4 04/23/2015   HGB 14.6 04/23/2015   HCT 43.0 04/23/2015  PLT 262 04/23/2015   GLUCOSE 89 04/01/2020   CHOL 206 (H) 08/10/2021   TRIG 86 08/10/2021   HDL 56 08/10/2021   LDLCALC 135 (H) 08/10/2021   ALT 12 08/10/2021   AST 16 08/10/2021   NA 142 04/01/2020   K 4.9 04/01/2020   CL 104 04/01/2020   CREATININE 0.77 04/01/2020   BUN 11 04/01/2020   CO2 24 04/01/2020   Labs dated 04/21/16: Normal chemistries and CBC. Cholesterol 172, triglycerides 95, HDL 53, LDL 100. Dated 05/04/17: Normal CBC and CMET. Cholesterol 167, triglycerides 96, HDL 51, LDL 97. Dated 07/27/17: cholesterol 164, triglycerides 74, HDL 53, LDL 96. Glucose 105. Otherwise CBC and CMET normal.  Dated 04/04/18: cholesterol 257, triglycerides 112, HDL 49, LDL 186. CBC and CMET normal. Dated 07/20/18: cholesterol 241, triglycerides 88, HDL 52. LDL 177. CMET and CBC normal.  Dated 12/18/18:  cholesterol 181, triglycerides 136, HDL 52, LDL 102   Ecg today shows NSR with rate 76. Nonspecific ST abnormality.   I have personally reviewed and interpreted this study.   Assessment / Plan: 1. Thoracic aortic aneurysm status post repair in 2007. Patient is asymptomatic. CT in October 2016 showed good result. Continue risk factor modification.  2. Hypercholesterolemia. Goal LDL is less than 100. Intolerant of statins.  Now on Repatha and tolerating well. Encourage compliance. Last LDL 92.   3. Tobacco abuse. Encouraged complete smoking cessation.  4. Obesity.   Follow up in one year.

## 2022-08-26 ENCOUNTER — Telehealth: Payer: Self-pay | Admitting: Pharmacist

## 2022-08-26 ENCOUNTER — Encounter: Payer: Self-pay | Admitting: Cardiology

## 2022-08-26 ENCOUNTER — Ambulatory Visit: Payer: BC Managed Care – PPO | Attending: Cardiology | Admitting: Cardiology

## 2022-08-26 ENCOUNTER — Other Ambulatory Visit: Payer: Self-pay

## 2022-08-26 VITALS — BP 138/62 | HR 76 | Ht 64.0 in | Wt 149.0 lb

## 2022-08-26 DIAGNOSIS — Z8679 Personal history of other diseases of the circulatory system: Secondary | ICD-10-CM

## 2022-08-26 DIAGNOSIS — Z9889 Other specified postprocedural states: Secondary | ICD-10-CM | POA: Diagnosis not present

## 2022-08-26 DIAGNOSIS — E7800 Pure hypercholesterolemia, unspecified: Secondary | ICD-10-CM

## 2022-08-26 NOTE — Telephone Encounter (Signed)
Putting in recently expired orders

## 2022-08-26 NOTE — Patient Instructions (Signed)
Medication Instructions:  Continue current medications  *If you need a refill on your cardiac medications before your next appointment, please call your pharmacy*   Lab Work: None Ordered   Testing/Procedures: None Ordered   Follow-Up: At [facility], you and your health needs are our priority.  As part of our continuing mission to provide you with exceptional heart care, we have created designated Provider Care Teams.  These Care Teams include your primary Cardiologist (physician) and Advanced Practice Providers (APPs -  Physician Assistants and Nurse Practitioners) who all work together to provide you with the care you need, when you need it.  We recommend signing up for the patient portal called "MyChart".  Sign up information is provided on this After Visit Summary.  MyChart is used to connect with patients for Virtual Visits (Telemedicine).  Patients are able to view lab/test results, encounter notes, upcoming appointments, etc.  Non-urgent messages can be sent to your provider as well.   To learn more about what you can do with MyChart, go to [email or web address].    Your next appointment:   1 year(s)  Provider:   Peter Martinique, MD     Other Instructions

## 2022-08-27 LAB — LIPID PANEL
Chol/HDL Ratio: 3.8 ratio (ref 0.0–4.4)
Cholesterol, Total: 204 mg/dL — ABNORMAL HIGH (ref 100–199)
HDL: 54 mg/dL (ref >39)
LDL Chol Calc (NIH): 137 mg/dL — ABNORMAL HIGH (ref 0–99)
Triglycerides: 71 mg/dL (ref 0–149)
VLDL Cholesterol Cal: 13 mg/dL (ref 5–40)

## 2022-08-27 LAB — HEPATIC FUNCTION PANEL
ALT: 13 IU/L (ref 0–32)
AST: 15 IU/L (ref 0–40)
Albumin: 4.3 g/dL (ref 3.8–4.9)
Alkaline Phosphatase: 73 IU/L (ref 44–121)
Bilirubin Total: 0.3 mg/dL (ref 0.0–1.2)
Bilirubin, Direct: 0.1 mg/dL (ref 0.00–0.40)
Total Protein: 7.1 g/dL (ref 6.0–8.5)

## 2022-10-02 ENCOUNTER — Other Ambulatory Visit: Payer: Self-pay | Admitting: Cardiology

## 2022-10-02 DIAGNOSIS — Z8679 Personal history of other diseases of the circulatory system: Secondary | ICD-10-CM

## 2022-10-02 DIAGNOSIS — E7800 Pure hypercholesterolemia, unspecified: Secondary | ICD-10-CM

## 2022-10-12 ENCOUNTER — Telehealth: Payer: Self-pay | Admitting: Cardiology

## 2022-10-12 ENCOUNTER — Other Ambulatory Visit: Payer: Self-pay

## 2022-10-12 DIAGNOSIS — E7800 Pure hypercholesterolemia, unspecified: Secondary | ICD-10-CM

## 2022-10-12 NOTE — Telephone Encounter (Signed)
Called patient, advised of message from [place].   LIPID ordered for 3 months.  Patient verbalized understanding  Thanks!

## 2022-10-12 NOTE — Telephone Encounter (Signed)
Gavepatient lab results.  She states having missed a dose or two of repatha in December or January.  She also state she has not had an injection in a month due to the pharmacy issues.  She just started back today.  She ask when she should have her labs drawn again since just starting, 3 months or prior to her yearly appt. ?

## 2022-10-12 NOTE — Telephone Encounter (Signed)
° °  Pt is returning call to get lab result °

## 2023-01-18 ENCOUNTER — Other Ambulatory Visit (HOSPITAL_COMMUNITY): Payer: Self-pay | Admitting: Adult Health Nurse Practitioner

## 2023-01-18 DIAGNOSIS — F17210 Nicotine dependence, cigarettes, uncomplicated: Secondary | ICD-10-CM

## 2023-02-24 ENCOUNTER — Encounter: Payer: Self-pay | Admitting: Gastroenterology

## 2023-02-24 ENCOUNTER — Ambulatory Visit (AMBULATORY_SURGERY_CENTER): Payer: BC Managed Care – PPO

## 2023-02-24 VITALS — Ht 64.0 in | Wt 125.0 lb

## 2023-02-24 DIAGNOSIS — Z1211 Encounter for screening for malignant neoplasm of colon: Secondary | ICD-10-CM

## 2023-02-24 MED ORDER — NA SULFATE-K SULFATE-MG SULF 17.5-3.13-1.6 GM/177ML PO SOLN: 1.0000 | Freq: Once | ORAL | 0 refills | Status: AC

## 2023-02-24 NOTE — Progress Notes (Signed)
Pre visit completed via phone call; Patient verified name, DOB, and address;  No egg or soy allergy known to patient  No issues known to pt with past sedation with any surgeries or procedures Patient denies ever being told they had issues or difficulty with intubation  No FH of Malignant Hyperthermia Pt is not on diet pills Pt is not on home 02  Pt is not on blood thinners  Pt reports issues with constipation (since a kid)- patient reports she has a regular bowel movement as long as she eats enough fruits/veggies and drinks enough fluids; also advised she can take a stool softener or laxative if needed;  No A fib or A flutter;  Have any cardiac testing pending--NO Insurance verified during PV appt--- BCBS  Pt can ambulate without assistance;  Pt denies use of chewing tobacco; Discussed diabetic/weight loss medication holds; Discussed NSAID holds; Checked BMI to be less than 50; Pt instructed to use Singlecare.com or GoodRx for a price reduction on prep  Patient's chart reviewed by Cathlyn Parsons CNRA prior to previsit and patient appropriate for the [facility].  Pre visit completed and red dot placed by patient's name on their procedure day (on provider's schedule).    Instructions printed and mailed to the patient per her request;

## 2023-03-01 ENCOUNTER — Ambulatory Visit (HOSPITAL_COMMUNITY): Payer: BC Managed Care – PPO

## 2023-03-15 ENCOUNTER — Encounter: Payer: Self-pay | Admitting: Gastroenterology

## 2023-03-15 ENCOUNTER — Ambulatory Visit: Payer: BC Managed Care – PPO | Admitting: Gastroenterology

## 2023-03-15 VITALS — BP 121/61 | HR 56 | Temp 97.8°F | Resp 16 | Ht 64.0 in | Wt 125.0 lb

## 2023-03-15 DIAGNOSIS — D123 Benign neoplasm of transverse colon: Secondary | ICD-10-CM

## 2023-03-15 DIAGNOSIS — Z1211 Encounter for screening for malignant neoplasm of colon: Secondary | ICD-10-CM | POA: Diagnosis not present

## 2023-03-15 MED ORDER — SODIUM CHLORIDE 0.9 % IV SOLN: 500.0000 mL | Freq: Once | INTRAVENOUS | Status: DC

## 2023-03-15 NOTE — Progress Notes (Signed)
[facility] History and Physical   Primary Care Physician:  Rebecka Apley, NP   Reason for Procedure:   Colon cancer screening  Plan:    Screening colonoscopy     HPI: April [name] is a 57 y.o. female undergoing initial average risk screening colonoscopy.  She has no family history of colon cancer and no chronic GI symptoms.    Past Medical History:  Diagnosis Date   Hypercholesterolemia    Hypertension    Thoracic aortic aneurysm (HCC)    Ascending. Status post repair in April 2007   Tobacco dependence     Past Surgical History:  Procedure Laterality Date   DESCENDING AORTIC ANEURYSM REPAIR  07/18/2005   Status post ascending with a #41mm Hemashield tube graft in April 2007.   TUBAL LIGATION     WISDOM TOOTH EXTRACTION      Prior to Admission medications   Medication Sig Start Date End Date Taking? Authorizing Provider  aspirin EC 325 MG tablet Take 325 mg by mouth daily.   Yes [provider]  aspirin EC 81 MG tablet Take 81 mg by mouth daily.   Yes [provider]  FIBER COMPLETE PO Take 1 tablet by mouth daily at 6 (six) AM.   Yes [provider]  Omega-3 Fatty Acids (FISH OIL) 1000 MG CAPS Take 2 capsules by mouth daily at 6 (six) AM.   Yes [provider]  nicotine (NICODERM CQ) 14 mg/24hr patch Place 14 mg onto the skin daily. Patient not taking: Reported on 02/24/2023    [provider]  REPATHA SURECLICK 140 MG/ML SOAJ INJECT 140MG  SUBCUTANEOUSLY  EVERY 2 WEEKS AS DIRECTED Patient taking differently: Inject 140 mg as directed every 14 (fourteen) days. INJECT 140MG  SUBCUTANEOUSLY EVERY 2 WEEKS AS DIRECTED 10/05/22   [place], Peter M, MD    Current Outpatient Medications  Medication Sig Dispense Refill   aspirin EC 325 MG tablet Take 325 mg by mouth daily.     aspirin EC 81 MG tablet Take 81 mg by mouth daily.     FIBER COMPLETE PO Take 1 tablet by mouth daily at 6 (six) AM.     Omega-3 Fatty  Acids (FISH OIL) 1000 MG CAPS Take 2 capsules by mouth daily at 6 (six) AM.     nicotine (NICODERM CQ) 14 mg/24hr patch Place 14 mg onto the skin daily. (Patient not taking: Reported on 02/24/2023)     REPATHA SURECLICK 140 MG/ML SOAJ INJECT 140MG  SUBCUTANEOUSLY  EVERY 2 WEEKS AS DIRECTED (Patient taking differently: Inject 140 mg as directed every 14 (fourteen) days. INJECT 140MG  SUBCUTANEOUSLY EVERY 2 WEEKS AS DIRECTED) 6 mL 3   Current Facility-Administered Medications  Medication Dose Route Frequency Provider Last Rate Last Admin   0.9 %  sodium chloride infusion  500 mL Intravenous Once Jenel Lucks, MD        Allergies as of 03/15/2023   (No Known Allergies)    Family History  Problem Relation Age of Onset   Hypertension Mother    Diabetes Brother    Colon polyps Neg Hx    Colon cancer Neg Hx    Esophageal cancer Neg Hx    Rectal cancer Neg Hx    Stomach cancer Neg Hx     Social History   Socioeconomic History   Marital status: Married    Spouse name: Not on file   Number of children: 0   Years of education: Not on file  Highest education level: Not on file  Occupational History    Employer: [employer]  Tobacco Use   Smoking status: Every Day    Current packs/day: 0.50    Types: Cigarettes   Smokeless tobacco: Never  Vaping Use   Vaping status: Never Used  Substance and Sexual Activity   Alcohol use: Yes    Alcohol/week: 1.0 standard drink of alcohol    Types: 1 Standard drinks or equivalent per week   Drug use: Never   Sexual activity: Not on file  Other Topics Concern   Not on file  Social History Narrative   Not on file   Social Determinants of Health   Financial Resource Strain: Low Risk  (01/18/2023)   Received from [facility], [facility]   Overall Financial Resource Strain (CARDIA)    Difficulty of Paying Living Expenses: Not hard at all  Food Insecurity: No Food Insecurity (01/18/2023)   Received from [facility], [facility]    Hunger Vital Sign    Worried About Running Out of Food in the Last Year: Never true    Ran Out of Food in the Last Year: Never true  Transportation Needs: No Transportation Needs (01/18/2023)   Received from [facility], [facility]   PRAPARE - Transportation    Lack of Transportation (Medical): No    Lack of Transportation (Non-Medical): No  Physical Activity: Not on file  Stress: Not on file  Social Connections: Unknown (11/28/2021)   Received from [facility], [facility]   Social Network    Social Network: Not on file  Intimate Partner Violence: Unknown (10/18/2021)   Received from [facility], [facility]   HITS    Physically Hurt: Not on file    Insult or Talk Down To: Not on file    Threaten Physical Harm: Not on file    Scream or Curse: Not on file    Review of Systems:  All other review of systems negative except as mentioned in the HPI.  Physical Exam: Vital signs BP (!) 122/47   Pulse 64   Temp 97.8 F (36.6 C)   Ht 5\' 4"  (1.626 m)   Wt 125 lb (56.7 kg)   SpO2 99%   BMI 21.46 kg/m   General:   Alert,  Well-developed, well-nourished, pleasant and cooperative in NAD Airway:  Mallampati 2 Lungs:  Clear throughout to auscultation.   Heart:  Regular rate and rhythm; no murmurs, clicks, rubs,  or gallops. Abdomen:  Soft, nontender and nondistended. Normal bowel sounds.   Neuro/Psych:  Normal mood and affect. A and O x 3   Sena Hoopingarner E. Tomasa Rand, MD [facility]

## 2023-03-15 NOTE — Op Note (Signed)
[facility] Patient Name: April [name] Procedure Date: 03/15/2023 9:28 AM MRN: [id number] Endoscopist: Lorin Picket E. Tomasa Rand , MD, [id number] Age: 57 Referring MD:  Date of Birth: 09-Nov-1965 Gender: Female Account #: [id number] Procedure:                Colonoscopy Indications:              Screening for colorectal malignant neoplasm, This                            is the patient's first colonoscopy Medicines:                Monitored Anesthesia Care Procedure:                Pre-Anesthesia Assessment:                           - Prior to the procedure, a History and Physical                            was performed, and patient medications and                            allergies were reviewed. The patient's tolerance of                            previous anesthesia was also reviewed. The risks                            and benefits of the procedure and the sedation                            options and risks were discussed with the patient.                            All questions were answered, and informed consent                            was obtained. Prior Anticoagulants: The patient has                            taken no anticoagulant or antiplatelet agents                            except for aspirin. ASA Grade Assessment: II - A                            patient with mild systemic disease. After reviewing                            the risks and benefits, the patient was deemed in                            satisfactory condition to undergo the procedure.  After obtaining informed consent, the colonoscope                            was passed under direct vision. Throughout the                            procedure, the patient's blood pressure, pulse, and                            oxygen saturations were monitored continuously. The                            Olympus CF-HQ190L [id number]) Colonoscope was                             introduced through the anus and advanced to the the                            terminal ileum, with identification of the                            appendiceal orifice and IC valve. The colonoscopy                            was somewhat difficult due to significant looping.                            Successful completion of the procedure was aided by                            using manual pressure. The patient tolerated the                            procedure well. The quality of the bowel                            preparation was good. The terminal ileum, ileocecal                            valve, appendiceal orifice, and rectum were                            photographed. The bowel preparation used was SUPREP                            via split dose instruction. Scope In: 9:37:21 AM Scope Out: 9:58:34 AM Scope Withdrawal Time: 0 hours 15 minutes 33 seconds  Total Procedure Duration: 0 hours 21 minutes 13 seconds  Findings:                 The perianal and digital rectal examinations were                            normal. Pertinent negatives include normal  sphincter tone and no palpable rectal lesions.                           Two sessile polyps were found in the proximal                            transverse colon. The polyps were 3 mm in size.                            These polyps were removed with a cold snare.                            Resection and retrieval were complete. Estimated                            blood loss was minimal.                           The exam was otherwise normal throughout the                            examined colon.                           The terminal ileum appeared normal.                           Non-bleeding internal hemorrhoids were found during                            retroflexion. The hemorrhoids were Grade I                            (internal hemorrhoids that do not prolapse).                            No additional abnormalities were found on                            retroflexion. Complications:            No immediate complications. Estimated Blood Loss:     Estimated blood loss was minimal. Impression:               - Two 3 mm polyps in the proximal transverse colon,                            removed with a cold snare. Resected and retrieved.                           - The examined portion of the ileum was normal.                           - Non-bleeding internal hemorrhoids. Recommendation:           - Patient has a contact number available for  emergencies. The signs and symptoms of potential                            delayed complications were discussed with the                            patient. Return to normal activities tomorrow.                            Written discharge instructions were provided to the                            patient.                           - Resume previous diet.                           - Continue present medications.                           - Await pathology results.                           - Repeat colonoscopy (date not yet determined) for                            surveillance based on pathology results. Kimmie Doren E. Tomasa Rand, MD 03/15/2023 10:03:47 AM This report has been signed electronically.

## 2023-03-15 NOTE — Progress Notes (Signed)
Called to room to assist during endoscopic procedure.  Patient ID and intended procedure confirmed with present staff. Received instructions for my participation in the procedure from the performing physician.  

## 2023-03-15 NOTE — Progress Notes (Signed)
Uneventful anesthetic. Report to pacu rn. Vss. Care resumed by rn. 

## 2023-03-15 NOTE — Progress Notes (Signed)
Pt's states no medical or surgical changes since previsit or office visit. 

## 2023-03-15 NOTE — Patient Instructions (Signed)
Resume all of your present medications as ordered.  Read all of the handouts given to you by your recovery room nurse.  Try to cut back on smoking.  YOU HAD AN ENDOSCOPIC PROCEDURE TODAY AT THE [facility]:   Refer to the procedure report that was given to you for any specific questions about what was found during the examination.  If the procedure report does not answer your questions, please call your gastroenterologist to clarify.  If you requested that your care partner not be given the details of your procedure findings, then the procedure report has been included in a sealed envelope for you to review at your convenience later.  YOU SHOULD EXPECT: Some feelings of bloating in the abdomen. Passage of more gas than usual.  Walking can help get rid of the air that was put into your GI tract during the procedure and reduce the bloating. If you had a lower endoscopy (such as a colonoscopy or flexible sigmoidoscopy) you may notice spotting of blood in your stool or on the toilet paper. If you underwent a bowel prep for your procedure, you may not have a normal bowel movement for a few days.  Please Note:  You might notice some irritation and congestion in your nose or some drainage.  This is from the oxygen used during your procedure.  There is no need for concern and it should clear up in a day or so.  SYMPTOMS TO REPORT IMMEDIATELY:  Following lower endoscopy (colonoscopy or flexible sigmoidoscopy):  Excessive amounts of blood in the stool  Significant tenderness or worsening of abdominal pains  Swelling of the abdomen that is new, acute  Fever of 100F or higher   For urgent or emergent issues, a gastroenterologist can be reached at any hour by calling [phone]. Do not use MyChart messaging for urgent concerns.    DIET:  We do recommend a small meal at first, but then you may proceed to your regular diet.  Drink plenty of fluids but you should avoid alcoholic beverages for  24 hours.  ACTIVITY:  You should plan to take it easy for the rest of today and you should NOT DRIVE or use heavy machinery until tomorrow (because of the sedation medicines used during the test).    FOLLOW UP: Our staff will call the number listed on your records the next business day following your procedure.  We will call around 7:15- 8:00 am to check on you and address any questions or concerns that you may have regarding the information given to you following your procedure. If we do not reach you, we will leave a message.     If any biopsies were taken you will be contacted by phone or by letter within the next 1-3 weeks.  Please call us at [phone] if you have not heard about the biopsies in 3 weeks.    SIGNATURES/CONFIDENTIALITY: You and/or your care partner have signed paperwork which will be entered into your electronic medical record.  These signatures attest to the fact that that the information above on your After Visit Summary has been reviewed and is understood.  Full responsibility of the confidentiality of this discharge information lies with you and/or your care-partner.

## 2023-03-16 ENCOUNTER — Telehealth: Payer: Self-pay | Admitting: *Deleted

## 2023-03-16 NOTE — Telephone Encounter (Signed)
Post procedure follow up call placed, no answer and VM is full.

## 2023-03-23 ENCOUNTER — Encounter: Payer: Self-pay | Admitting: Gastroenterology

## 2023-03-24 ENCOUNTER — Ambulatory Visit (HOSPITAL_COMMUNITY): Payer: BC Managed Care – PPO

## 2023-04-26 ENCOUNTER — Ambulatory Visit (HOSPITAL_COMMUNITY)
Admission: RE | Admit: 2023-04-26 | Discharge: 2023-04-26 | Disposition: A | Discharge status: Home / Self Care | Payer: BC Managed Care – PPO | Source: Ambulatory Visit | Attending: Adult Health Nurse Practitioner | Admitting: Adult Health Nurse Practitioner

## 2023-04-26 DIAGNOSIS — F17210 Nicotine dependence, cigarettes, uncomplicated: Secondary | ICD-10-CM | POA: Diagnosis present

## 2023-08-23 NOTE — Progress Notes (Signed)
April [name] Date of Birth: 1966/05/30 Medical Record #[id number]  History of Present Illness: April [name] is seen today for followup thoracic aneurysm. She status post thoracic aneurysm repair in 2007 by Dr. Laneta Simmers. No CAD at that time. Last follow up CT in October 2016. She has a history of HLD and tobacco abuse. She is on Repatha. Intolerant of statins. Zetia did not change her cholesterol much.    On follow up today she is doing well. 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 Denies any chest pain or SOB. Had a recent CT for cancer screening which was negative for this but did show coronary calcification.   Allergies as of 08/29/2023   No Known Allergies      Medication List        Accurate as of August 29, 2023 11:48 AM. If you have any questions, ask your nurse or doctor.          aspirin EC 81 MG tablet Take 81 mg by mouth daily. What changed: Another medication with the same name was removed. Continue taking this medication, and follow the directions you see here. Changed by: Marsalis Beaulieu [place]   FIBER COMPLETE PO Take 1 tablet by mouth daily at 6 (six) AM.   Fish Oil 1000 MG Caps Take 2 capsules by mouth daily at 6 (six) AM.   Nicoderm CQ 14 mg/24hr patch Generic drug: nicotine Place 14 mg onto the skin daily.   Repatha SureClick 140 MG/ML Soaj Generic drug: Evolocumab INJECT 140MG  SUBCUTANEOUSLY  EVERY 2 WEEKS AS DIRECTED What changed: See the new instructions.        No Known Allergies  Past Medical History:  Diagnosis Date   Hypercholesterolemia    Hypertension    Thoracic aortic aneurysm (HCC)    Ascending. Status post repair in April 2007   Tobacco dependence     Past Surgical History:  Procedure Laterality Date   DESCENDING AORTIC ANEURYSM REPAIR  07/18/2005   Status post ascending with a #74mm Hemashield tube graft in April 2007.   TUBAL LIGATION     WISDOM TOOTH EXTRACTION      Social History   Tobacco Use  Smoking  Status Every Day   Current packs/day: 0.50   Types: Cigarettes  Smokeless Tobacco Never    Social History   Substance and Sexual Activity  Alcohol Use Yes   Alcohol/week: 1.0 standard drink of alcohol   Types: 1 Standard drinks or equivalent per week    Family History  Problem Relation Age of Onset   Hypertension Mother    Diabetes Brother    Colon polyps Neg Hx    Colon cancer Neg Hx    Esophageal cancer Neg Hx    Rectal cancer Neg Hx    Stomach cancer Neg Hx     Review of Systems: As noted in history of present illness.  All other systems were reviewed and are negative.  Physical Exam: BP 132/80   Pulse (!) 56   Ht 5\' 4"  (1.626 m)   Wt 114 lb 12.8 oz (52.1 kg)   SpO2 95%   BMI 19.71 kg/m  GENERAL:  Well appearing WF in NAD HEENT:  PERRL, EOMI, sclera are clear. Oropharynx is clear. NECK:  No jugular venous distention, carotid upstroke brisk and symmetric, no bruits, no thyromegaly or adenopathy LUNGS:  Clear to auscultation bilaterally CHEST:  Unremarkable HEART:  RRR,  PMI not displaced or sustained,S1 and S2  within normal limits, no S3, no S4: no clicks, no rubs. Soft SEM. ABD:  Soft, nontender. BS +, no masses or bruits. No hepatomegaly, no splenomegaly EXT:  2 + pulses throughout, no edema, no cyanosis no clubbing SKIN:  Warm and dry.  No rashes NEURO:  Alert and oriented x 3. Cranial nerves II through XII intact. PSYCH:  Cognitively intact      LABORATORY DATA: Lab Results  Component Value Date   WBC 8.4 04/23/2015   HGB 14.6 04/23/2015   HCT 43.0 04/23/2015   PLT 262 04/23/2015   GLUCOSE 89 04/01/2020   CHOL 204 (H) 08/26/2022   TRIG 71 08/26/2022   HDL 54 08/26/2022   LDLCALC 137 (H) 08/26/2022   ALT 13 08/26/2022   AST 15 08/26/2022   NA 142 04/01/2020   K 4.9 04/01/2020   CL 104 04/01/2020   CREATININE 0.77 04/01/2020   BUN 11 04/01/2020   CO2 24 04/01/2020   Labs dated 04/21/16: Normal chemistries and CBC. Cholesterol 172,  triglycerides 95, HDL 53, LDL 100. Dated 05/04/17: Normal CBC and CMET. Cholesterol 167, triglycerides 96, HDL 51, LDL 97. Dated 07/27/17: cholesterol 164, triglycerides 74, HDL 53, LDL 96. Glucose 105. Otherwise CBC and CMET normal.  Dated 04/04/18: cholesterol 257, triglycerides 112, HDL 49, LDL 186. CBC and CMET normal. Dated 07/20/18: cholesterol 241, triglycerides 88, HDL 52. LDL 177. CMET and CBC normal.  Dated 12/18/18: cholesterol 181, triglycerides 136, HDL 52, LDL 102 Dated 06/12/23: CBC. CMET, cholesterol 154, triglycerides 70, HDL 61, LDL 79  EKG Interpretation Date/Time:  Tuesday August 29 2023 11:33:52 EST Ventricular Rate:  56 PR Interval:  170 QRS Duration:  78 QT Interval:  402 QTC Calculation: 387 R Axis:   50  Text Interpretation: Sinus bradycardia Possible Left atrial enlargement When compared with ECG of Aug 26, 2022 nonspecific T wave abnormality has resolved. Confirmed by [place], Fabiana Dromgoole [phone]) on 08/29/2023 11:39:50 AM     Assessment / Plan: 1. Thoracic aortic aneurysm status post repair in 2007. Patient is asymptomatic. CT in October 2016 showed good result. Continue risk factor modification. BP is normal. I did request that her brothers be screened for aortic disease  2. Hypercholesterolemia. Goal LDL is less than 70 with coronary calcification but she is Intolerant of statins and Zetia did not change her lipids.  Now on Repatha and tolerating well. Last LDL 79.   3. Tobacco abuse. Encourage complete smoking cessation.  4. Coronary artery calcification. No symptoms. Focus on risk factor modification  Follow up in one year.

## 2023-08-29 ENCOUNTER — Encounter: Payer: Self-pay | Admitting: Cardiology

## 2023-08-29 ENCOUNTER — Ambulatory Visit: Payer: BC Managed Care – PPO | Attending: Cardiology | Admitting: Cardiology

## 2023-08-29 VITALS — BP 132/80 | HR 56 | Ht 64.0 in | Wt 114.8 lb

## 2023-08-29 DIAGNOSIS — Z9889 Other specified postprocedural states: Secondary | ICD-10-CM

## 2023-08-29 DIAGNOSIS — Z8679 Personal history of other diseases of the circulatory system: Secondary | ICD-10-CM

## 2023-08-29 DIAGNOSIS — E7800 Pure hypercholesterolemia, unspecified: Secondary | ICD-10-CM

## 2023-08-29 DIAGNOSIS — I2510 Atherosclerotic heart disease of native coronary artery without angina pectoris: Secondary | ICD-10-CM

## 2023-08-29 NOTE — Patient Instructions (Signed)
Medication Instructions:  Continue same medications *If you need a refill on your cardiac medications before your next appointment, please call your pharmacy*   Lab Work: None ordered   Testing/Procedures: None ordered   Follow-Up: At [facility], you and your health needs are our priority.  As part of our continuing mission to provide you with exceptional heart care, we have created designated Provider Care Teams.  These Care Teams include your primary Cardiologist (physician) and Advanced Practice Providers (APPs -  Physician Assistants and Nurse Practitioners) who all work together to provide you with the care you need, when you need it.  We recommend signing up for the patient portal called "MyChart".  Sign up information is provided on this After Visit Summary.  MyChart is used to connect with patients for Virtual Visits (Telemedicine).  Patients are able to view lab/test results, encounter notes, upcoming appointments, etc.  Non-urgent messages can be sent to your provider as well.   To learn more about what you can do with MyChart, go to [email or web address].    Your next appointment:  1 year   Call in Oct to schedule Feb appointment  ( New Office )    Provider:  Dr.Jordan

## 2023-09-07 ENCOUNTER — Telehealth: Payer: Self-pay | Admitting: Cardiology

## 2023-09-07 DIAGNOSIS — Z8679 Personal history of other diseases of the circulatory system: Secondary | ICD-10-CM

## 2023-09-07 DIAGNOSIS — E7800 Pure hypercholesterolemia, unspecified: Secondary | ICD-10-CM

## 2023-09-07 MED ORDER — REPATHA SURECLICK 140 MG/ML ~~LOC~~ SOAJ: SUBCUTANEOUS | 0 refills | Status: DC

## 2023-09-07 NOTE — Telephone Encounter (Signed)
*  STAT* If patient is at the pharmacy, call can be transferred to refill team.   1. Which medications need to be refilled? (please list name of each medication and dose if known) REPATHA SURECLICK 140 MG/ML SOAJ   2. Which pharmacy/location (including street and city if local pharmacy) is medication to be sent to? [facility] [address] [place] - [address]   3. Do they need a 30 day or 90 day supply? 90

## 2023-10-02 ENCOUNTER — Telehealth: Payer: Self-pay | Admitting: Pharmacy Technician

## 2023-10-02 ENCOUNTER — Other Ambulatory Visit (HOSPITAL_COMMUNITY): Payer: Self-pay

## 2023-10-02 ENCOUNTER — Other Ambulatory Visit: Payer: Self-pay

## 2023-10-02 DIAGNOSIS — E7800 Pure hypercholesterolemia, unspecified: Secondary | ICD-10-CM

## 2023-10-02 NOTE — Telephone Encounter (Signed)
 PA request has been Cancelled.  Will submit once new labs are done

## 2023-10-02 NOTE — Telephone Encounter (Signed)
 Spoke to patient she stated she just started a new job.She does not have insurance at present.She requested Repatha samples until her insurance comes into effect.I will send message to our Pharmacist.

## 2023-10-02 NOTE — Telephone Encounter (Signed)
 Pharmacy Patient Advocate Encounter   Received notification from Fax that prior authorization for Repatha is required/requested.   Insurance verification completed.   The patient is insured through [employer] .   Per test claim: PA required; PA submitted to above mentioned insurance via CoverMyMeds Key/confirmation #/EOC [facility] Status is pending

## 2023-10-03 NOTE — Telephone Encounter (Signed)
 I can probably get her 2 pens (a 4 week supply), but how long before she has insurance?  I'll get them ready tomorrow when I'm back in the building.

## 2023-10-03 NOTE — Telephone Encounter (Signed)
Called patient no answer.Unable to leave a message voice mail full. 

## 2023-10-24 NOTE — Telephone Encounter (Signed)
 Spoke to patient she stated she will need 2 Repatha pens.Stated she will have new insurance 5/24.

## 2023-10-25 MED ORDER — REPATHA SURECLICK 140 MG/ML ~~LOC~~ SOAJ: 140.0000 mg | SUBCUTANEOUS | 0 refills | Status: DC

## 2023-10-25 NOTE — Telephone Encounter (Signed)
 Spoke to patient she will pick up Repatha pens this week.

## 2023-10-25 NOTE — Addendum Note (Signed)
 Addended by: Rosalee Kaufman on: 10/25/2023 10:47 AM   Modules accepted: Orders

## 2023-10-25 NOTE — Telephone Encounter (Signed)
 Samples are ready.  Please have patient tell front desk that her samples are in the refrigerator.

## 2024-01-05 ENCOUNTER — Other Ambulatory Visit: Payer: Self-pay

## 2024-01-05 ENCOUNTER — Telehealth: Payer: Self-pay

## 2024-01-05 DIAGNOSIS — E7800 Pure hypercholesterolemia, unspecified: Secondary | ICD-10-CM

## 2024-01-05 LAB — LIPID PANEL
Chol/HDL Ratio: 2.4 ratio (ref 0.0–4.4)
Cholesterol, Total: 154 mg/dL (ref 100–199)
HDL: 63 mg/dL (ref >39)
LDL Chol Calc (NIH): 80 mg/dL (ref 0–99)
Triglycerides: 51 mg/dL (ref 0–149)
VLDL Cholesterol Cal: 11 mg/dL (ref 5–40)

## 2024-01-05 NOTE — Telephone Encounter (Signed)
 Sasha called to have labs released  Order expired

## 2024-01-06 ENCOUNTER — Ambulatory Visit: Payer: Self-pay | Admitting: Cardiology

## 2024-01-10 ENCOUNTER — Telehealth: Payer: Self-pay

## 2024-01-10 NOTE — Telephone Encounter (Signed)
 Spoke to patient she stated she has insurance.Stated she needs refill on Repatha .She wants refill sent to [facility] in [place].Advised I will send message to Pharm D.

## 2024-01-11 ENCOUNTER — Telehealth: Payer: Self-pay | Admitting: Pharmacy Technician

## 2024-01-11 MED ORDER — REPATHA SURECLICK 140 MG/ML ~~LOC~~ SOAJ: 140.0000 mg | SUBCUTANEOUS | 3 refills | Status: AC

## 2024-01-11 NOTE — Telephone Encounter (Signed)
 Pharmacy Patient Advocate Encounter   Received notification from CoverMyMeds that prior authorization for Repatha  SureClick 140MG /ML auto-injectors is required/requested.   Insurance verification completed.   The patient is insured through [employer] .   Per test claim: PA required; PA submitted to above mentioned insurance via CoverMyMeds Key/confirmation #/EOC [id number] Status is pending

## 2024-01-11 NOTE — Telephone Encounter (Signed)
 Pharmacy Patient Advocate Encounter  Received notification from [facility] that Prior Authorization for Repatha  SureClick 140MG /ML auto-injectors has been APPROVED from 01/11/24 to 01/10/25. Spoke to pharmacy to process.Copay is $74.97 for 90 days .    PA #/Case ID/Reference #: [id number]

## 2024-08-22 NOTE — Progress Notes (Unsigned)
 "  April [name] Date of Birth: October 20, 1965 Medical Record #[id number]  History of Present Illness: April [name] is seen today for followup thoracic aneurysm. She status post thoracic aneurysm repair in 2007 by Dr. Lucas. No CAD at that time. Last follow up CT in October 2016. She has a history of HLD and tobacco abuse. She is on Repatha . Intolerant of statins. Zetia did not change her cholesterol much.    On follow up today she is doing well. Did just lose her job at [employer] but has a new job with [employer]. Denies any chest pain or SOB. Had a recent CT for cancer screening which was negative for this but did show coronary calcification.   Allergies as of 08/26/2024   No Known Allergies      Medication List        Accurate as of August 22, 2024 12:45 PM. If you have any questions, ask your nurse or doctor.          aspirin EC 81 MG tablet Take 81 mg by mouth daily.   FIBER COMPLETE PO Take 1 tablet by mouth daily at 6 (six) AM.   Fish Oil 1000 MG Caps Take 2 capsules by mouth daily at 6 (six) AM.   Nicoderm CQ 14 mg/24hr patch Generic drug: nicotine Place 14 mg onto the skin daily.   Repatha  SureClick 140 MG/ML Soaj Generic drug: Evolocumab  Inject 140 mg into the skin every 14 (fourteen) days.        No Known Allergies  Past Medical History:  Diagnosis Date   Hypercholesterolemia    Hypertension    Thoracic aortic aneurysm    Ascending. Status post repair in April 2007   Tobacco dependence     Past Surgical History:  Procedure Laterality Date   DESCENDING AORTIC ANEURYSM REPAIR  07/18/2005   Status post ascending with a #33mm Hemashield tube graft in April 2007.   TUBAL LIGATION     WISDOM TOOTH EXTRACTION      Social History   Tobacco Use  Smoking Status Every Day   Current packs/day: 0.50   Types: Cigarettes  Smokeless Tobacco Never    Social History   Substance and Sexual Activity  Alcohol Use Yes   Alcohol/week: 1.0 standard drink of alcohol    Types: 1 Standard drinks or equivalent per week    Family History  Problem Relation Age of Onset   Hypertension Mother    Diabetes Brother    Colon polyps Neg Hx    Colon cancer Neg Hx    Esophageal cancer Neg Hx    Rectal cancer Neg Hx    Stomach cancer Neg Hx     Review of Systems: As noted in history of present illness.  All other systems were reviewed and are negative.  Physical Exam: There were no vitals taken for this visit. GENERAL:  Well appearing WF in NAD HEENT:  PERRL, EOMI, sclera are clear. Oropharynx is clear. NECK:  No jugular venous distention, carotid upstroke brisk and symmetric, no bruits, no thyromegaly or adenopathy LUNGS:  Clear to auscultation bilaterally CHEST:  Unremarkable HEART:  RRR,  PMI not displaced or sustained,S1 and S2 within normal limits, no S3, no S4: no clicks, no rubs. Soft SEM. ABD:  Soft, nontender. BS +, no masses or bruits. No hepatomegaly, no splenomegaly EXT:  2 + pulses throughout, no edema, no cyanosis no clubbing SKIN:  Warm and dry.  No rashes NEURO:  Alert and oriented x 3. Cranial  nerves II through XII intact. PSYCH:  Cognitively intact      LABORATORY DATA: Lab Results  Component Value Date   WBC 8.4 04/23/2015   HGB 14.6 04/23/2015   HCT 43.0 04/23/2015   PLT 262 04/23/2015   GLUCOSE 89 04/01/2020   CHOL 154 01/05/2024   TRIG 51 01/05/2024   HDL 63 01/05/2024   LDLCALC 80 01/05/2024   ALT 13 08/26/2022   AST 15 08/26/2022   NA 142 04/01/2020   K 4.9 04/01/2020   CL 104 04/01/2020   CREATININE 0.77 04/01/2020   BUN 11 04/01/2020   CO2 24 04/01/2020   Labs dated 04/21/16: Normal chemistries and CBC. Cholesterol 172, triglycerides 95, HDL 53, LDL 100. Dated 05/04/17: Normal CBC and CMET. Cholesterol 167, triglycerides 96, HDL 51, LDL 97. Dated 07/27/17: cholesterol 164, triglycerides 74, HDL 53, LDL 96. Glucose 105. Otherwise CBC and CMET normal.  Dated 04/04/18: cholesterol 257, triglycerides 112, HDL 49, LDL  186. CBC and CMET normal. Dated 07/20/18: cholesterol 241, triglycerides 88, HDL 52. LDL 177. CMET and CBC normal.  Dated 12/18/18: cholesterol 181, triglycerides 136, HDL 52, LDL 102 Dated 06/12/23: CBC. CMET, cholesterol 154, triglycerides 70, HDL 61, LDL 79        Assessment / Plan: 1. Thoracic aortic aneurysm status post repair in 2007. Patient is asymptomatic. CT in October 2016 showed good result. Continue risk factor modification. BP is normal. I did request that her brothers be screened for aortic disease  2. Hypercholesterolemia. Goal LDL is less than 70 with coronary calcification but she is Intolerant of statins and Zetia did not change her lipids.  Now on Repatha  and tolerating well. Last LDL 79.   3. Tobacco abuse. Encourage complete smoking cessation.  4. Coronary artery calcification. No symptoms. Focus on risk factor modification  Follow up in one year.  "

## 2024-08-26 ENCOUNTER — Ambulatory Visit: Admitting: Cardiology
